# Patient Record
Sex: Female | Born: 1945 | Race: White | Hispanic: No | Marital: Married | Smoking: Current every day smoker
Health system: Southern US, Community
[De-identification: ages and names within clinical notes are randomized; demographics above are authoritative.]

## PROBLEM LIST (undated history)

## (undated) DIAGNOSIS — F419 Anxiety disorder, unspecified: Secondary | ICD-10-CM

## (undated) DIAGNOSIS — M1990 Unspecified osteoarthritis, unspecified site: Secondary | ICD-10-CM

## (undated) DIAGNOSIS — H269 Unspecified cataract: Secondary | ICD-10-CM

## (undated) DIAGNOSIS — M5440 Lumbago with sciatica, unspecified side: Secondary | ICD-10-CM

## (undated) DIAGNOSIS — R51 Headache: Secondary | ICD-10-CM

## (undated) DIAGNOSIS — E7800 Pure hypercholesterolemia, unspecified: Secondary | ICD-10-CM

## (undated) DIAGNOSIS — K52831 Collagenous colitis: Secondary | ICD-10-CM

## (undated) DIAGNOSIS — J069 Acute upper respiratory infection, unspecified: Secondary | ICD-10-CM

## (undated) DIAGNOSIS — R519 Headache, unspecified: Secondary | ICD-10-CM

## (undated) HISTORY — DX: Unspecified cataract: H26.9

## (undated) HISTORY — PX: REFRACTIVE SURGERY: SHX103

## (undated) HISTORY — PX: TONSILLECTOMY: SUR1361

## (undated) HISTORY — PX: CATARACT EXTRACTION: SUR2

## (undated) HISTORY — PX: OTHER SURGICAL HISTORY: SHX169

## (undated) HISTORY — DX: Pure hypercholesterolemia, unspecified: E78.00

## (undated) HISTORY — DX: Collagenous colitis: K52.831

---

## 2009-11-07 ENCOUNTER — Observation Stay: Payer: Self-pay | Admitting: Internal Medicine

## 2014-07-27 ENCOUNTER — Ambulatory Visit (INDEPENDENT_AMBULATORY_CARE_PROVIDER_SITE_OTHER): Payer: Medicare Other | Admitting: Podiatry

## 2014-07-27 ENCOUNTER — Encounter: Payer: Self-pay | Admitting: Podiatry

## 2014-07-27 VITALS — BP 118/71 | HR 62 | Resp 16 | Ht 67.0 in | Wt 145.0 lb

## 2014-07-27 DIAGNOSIS — L603 Nail dystrophy: Secondary | ICD-10-CM

## 2014-07-27 NOTE — Progress Notes (Signed)
   Subjective:    Patient ID: Molly [name], female    DOB: [birth year], 68 y.o.   MRN: [id number]  HPI Comments: Concerned about the second toenail right foot, its better today had a pedicure and she cleaned most of it up under the nail. Its lifted a little bit      Review of Systems  All other systems reviewed and are negative.      Objective:   Physical Exam: I have reviewed her past medical history medications allergies surgery social history and review of systems. Pulses are strongly palpable bilateral. Neurologic sensorium is intact since was the monofilament. Deep tendon reflexes intact bilateral muscle strength +5 over 5 dorsiflexion plantar flexors and inverters everters onto the musculature is intact. Orthopedic evaluation demonstrates mild hammertoe deformities bilateral second digit of the right foot seems to be the worst. Cutaneous evaluation demonstrates a well-hydrated uterus with a thick dystrophic nail to the second digit of the right foot. Sharp radial nail margins are prevalent. Distal clavus is also noted.        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  Plan: Discussed etiology pathology conservative versus surgical therapies discussed the need for nail debridement on a continual basis and I will follow-up with her as needed.

## 2015-06-23 ENCOUNTER — Encounter: Payer: Self-pay | Admitting: *Deleted

## 2015-06-26 ENCOUNTER — Ambulatory Visit: Payer: Medicare Other | Admitting: *Deleted

## 2015-06-26 ENCOUNTER — Ambulatory Visit
Admission: RE | Admit: 2015-06-26 | Discharge: 2015-06-26 | Disposition: A | Discharge status: Home / Self Care | Payer: Medicare Other | Source: Ambulatory Visit | Attending: Gastroenterology | Admitting: Gastroenterology

## 2015-06-26 ENCOUNTER — Encounter: Payer: Self-pay | Admitting: *Deleted

## 2015-06-26 ENCOUNTER — Encounter
Admission: RE | Disposition: A | Discharge status: Home / Self Care | Payer: Self-pay | Source: Ambulatory Visit | Attending: Gastroenterology

## 2015-06-26 DIAGNOSIS — K5730 Diverticulosis of large intestine without perforation or abscess without bleeding: Secondary | ICD-10-CM | POA: Diagnosis not present

## 2015-06-26 DIAGNOSIS — Z7982 Long term (current) use of aspirin: Secondary | ICD-10-CM | POA: Diagnosis not present

## 2015-06-26 DIAGNOSIS — F419 Anxiety disorder, unspecified: Secondary | ICD-10-CM | POA: Insufficient documentation

## 2015-06-26 DIAGNOSIS — Z86010 Personal history of colonic polyps: Secondary | ICD-10-CM | POA: Insufficient documentation

## 2015-06-26 DIAGNOSIS — Z79899 Other long term (current) drug therapy: Secondary | ICD-10-CM | POA: Insufficient documentation

## 2015-06-26 DIAGNOSIS — E780 Pure hypercholesterolemia: Secondary | ICD-10-CM | POA: Diagnosis not present

## 2015-06-26 DIAGNOSIS — M5440 Lumbago with sciatica, unspecified side: Secondary | ICD-10-CM | POA: Diagnosis not present

## 2015-06-26 HISTORY — DX: Anxiety disorder, unspecified: F41.9

## 2015-06-26 HISTORY — DX: Lumbago with sciatica, unspecified side: M54.40

## 2015-06-26 HISTORY — DX: Headache, unspecified: R51.9

## 2015-06-26 HISTORY — DX: Headache: R51

## 2015-06-26 HISTORY — PX: COLONOSCOPY WITH PROPOFOL: SHX5780

## 2015-06-26 SURGERY — COLONOSCOPY WITH PROPOFOL
Anesthesia: General

## 2015-06-26 MED ORDER — PHENYLEPHRINE HCL 10 MG/ML IJ SOLN
INTRAMUSCULAR | Status: DC | PRN
  Administered 2015-06-26 (×2): 100 ug via INTRAVENOUS

## 2015-06-26 MED ORDER — FENTANYL CITRATE (PF) 100 MCG/2ML IJ SOLN
INTRAMUSCULAR | Status: DC | PRN
  Administered 2015-06-26: 1 ug via INTRAVENOUS

## 2015-06-26 MED ORDER — SODIUM CHLORIDE 0.9 % IV SOLN
INTRAVENOUS | Status: DC
  Administered 2015-06-26 (×2): via INTRAVENOUS

## 2015-06-26 MED ORDER — PROPOFOL 10 MG/ML IV BOLUS
INTRAVENOUS | Status: DC | PRN
  Administered 2015-06-26: 40 mg via INTRAVENOUS

## 2015-06-26 MED ORDER — SODIUM CHLORIDE 0.9 % IV SOLN: INTRAVENOUS | Status: DC

## 2015-06-26 MED ORDER — PROPOFOL 500 MG/50ML IV EMUL
INTRAVENOUS | Status: DC | PRN
  Administered 2015-06-26: 120 ug/kg/min via INTRAVENOUS

## 2015-06-26 MED ORDER — MIDAZOLAM HCL 5 MG/5ML IJ SOLN
INTRAMUSCULAR | Status: DC | PRN
  Administered 2015-06-26: 1 mg via INTRAVENOUS

## 2015-06-26 MED ORDER — EPHEDRINE SULFATE 50 MG/ML IJ SOLN
INTRAMUSCULAR | Status: DC | PRN
  Administered 2015-06-26: 25 mg via INTRAVENOUS

## 2015-06-26 NOTE — Transfer of Care (Signed)
Immediate Anesthesia Transfer of Care Note  Patient: Molly [name]  Procedure(s) Performed: Procedure(s): COLONOSCOPY WITH PROPOFOL (N/A)  Patient Location: PACU  Anesthesia Type:General  Level of Consciousness: awake and alert   Airway & Oxygen Therapy: Patient Spontanous Breathing and Patient connected to nasal cannula oxygen  Post-op Assessment: Report given to RN and Post -op Vital signs reviewed and stable  Post vital signs: Reviewed and stable  Last Vitals:  Filed Vitals:   06/26/15 0936  BP:   Pulse:   Temp: 36.2 C  Resp:     Complications: No apparent anesthesia complications

## 2015-06-26 NOTE — Anesthesia Preprocedure Evaluation (Signed)
Anesthesia Evaluation  Patient identified by MRN, date of birth, ID band Patient awake    Reviewed: Allergy & Precautions, NPO status , Patient's Chart, lab work & pertinent test results  Airway Mallampati: I  TM Distance: >3 FB Neck ROM: Limited    Dental  (+) Teeth Intact   Pulmonary Current Smoker,  About 5 cigarettes a day.   Pulmonary exam normal        Cardiovascular Exercise Tolerance: Good negative cardio ROS Normal cardiovascular exam     Neuro/Psych    GI/Hepatic negative GI ROS,   Endo/Other    Renal/GU      Musculoskeletal  (+) Arthritis , Osteoarthritis,    Abdominal (+)  Abdomen: soft.    Peds  Hematology   Anesthesia Other Findings   Reproductive/Obstetrics                             Anesthesia Physical Anesthesia Plan  ASA: II  Anesthesia Plan: General   Post-op Pain Management:    Induction: Intravenous  Airway Management Planned: Nasal Cannula  Additional Equipment:   Intra-op Plan:   Post-operative Plan:   Informed Consent: I have reviewed the patients History and Physical, chart, labs and discussed the procedure including the risks, benefits and alternatives for the proposed anesthesia with the patient or authorized representative who has indicated his/her understanding and acceptance.     Plan Discussed with: CRNA  Anesthesia Plan Comments:         Anesthesia Quick Evaluation

## 2015-06-26 NOTE — Op Note (Signed)
[facility] Gastroenterology Patient Name: Molly [name] Procedure Date: 06/26/2015 9:00 AM MRN: [id number] Account #: [id number] Date of Birth: May 07, 1946 Admit Type: Outpatient Age: 69 Room: [facility] ENDO ROOM 3 Gender: Female Note Status: Finalized Procedure:         Colonoscopy Indications:       Personal history of colonic polyps Providers:         Lollie Sails, MD Referring MD:      Ocie Cornfield. Ouida Sills, MD (Referring MD) Medicines:         Monitored Anesthesia Care Complications:     No immediate complications. Procedure:         Pre-Anesthesia Assessment:                    - ASA Grade Assessment: II - A patient with mild systemic                     disease.                    After obtaining informed consent, the colonoscope was                     passed under direct vision. Throughout the procedure, the                     patient's blood pressure, pulse, and oxygen saturations                     were monitored continuously. The Colonoscope was                     introduced through the anus with the intention of                     advancing to the cecum. The scope was advanced to the                     sigmoid colon before the procedure was aborted.                     Medications were given. The colonoscopy was unusually                     difficult due to multiple diverticula in the colon,                     unsatisfactory bowel prep and a tortuous colon. The                     quality of the bowel preparation was poor. Findings:      Multiple small-mouthed diverticula were found in the sigmoid colon.      The digital rectal exam was normal. Impression:        - Preparation of the colon was poor.                    - Diverticulosis in the sigmoid colon.                    - No specimens collected. Recommendation:    - Put patient on a clear liquid diet starting today. Procedure Code(s): --- Professional ---                    [phone],  Sigmoidoscopy,  flexible; diagnostic, including                     collection of specimen(s) by brushing or washing, when                     performed (separate procedure) Diagnosis Code(s): --- Professional ---                    V12.72, Personal history of colonic polyps                    562.10, Diverticulosis of colon (without mention of                     hemorrhage) CPT copyright 2014 American Medical Association. All rights reserved. The codes documented in this report are preliminary and upon coder review may  be revised to meet current compliance requirements. Lollie Sails, MD 06/26/2015 9:33:47 AM This report has been signed electronically. Number of Addenda: 0 Note Initiated On: 06/26/2015 9:00 AM Total Procedure Duration: 0 hours 9 minutes 21 seconds       [facility]

## 2015-06-26 NOTE — Anesthesia Procedure Notes (Signed)
Date/Time: 06/26/2015 9:30 AM Performed by: Allean Found Pre-anesthesia Checklist: Patient identified, Emergency Drugs available, Suction available, Patient being monitored and Timeout performed Oxygen Delivery Method: Nasal cannula

## 2015-06-26 NOTE — H&P (Signed)
Outpatient short stay form Pre-procedure 06/26/2015 9:09 AM Lollie Sails MD  Primary Physician: Dr. Frazier Richards  Reason for visit:  Colonoscopy  History of present illness:  Patient is a 69 year old female presenting today for a colonoscopy. Her last colonoscopy was in 2011. She has had 2 colonoscopies both of which showed tubular adenomas. He tolerated her prep well. It has been several days since she took her 81 mg aspirin. She takes no other aspirin or anticoagulation products.    Current facility-administered medications:  .  0.9 %  sodium chloride infusion, , Intravenous, Continuous, Lollie Sails, MD, Last Rate: 20 mL/hr at 06/26/15 0855 .  0.9 %  sodium chloride infusion, , Intravenous, Continuous, Lollie Sails, MD  Prescriptions prior to admission  Medication Sig Dispense Refill Last Dose  . aspirin 81 MG tablet Take 81 mg by mouth daily.     Marland Kitchen BIOGAIA PROBIOTIC (BIOGAIA PROBIOTIC) LIQD Take by mouth daily at 8 pm.     . Cholecalciferol 2000 UNITS CAPS Take 2,000 Units by mouth every morning.     . citalopram (CELEXA) 10 MG tablet Take 10 mg by mouth daily.     . pravastatin (PRAVACHOL) 40 MG tablet Take 20 mg by mouth daily.    Taking     No Known Allergies   Past Medical History  Diagnosis Date  . High cholesterol   . Headache   . Anxiety   . 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 I  have discussed the risks benefits and complications of procedures to include not limited to bleeding, infection, perforation and the risk of sedation and the patient wishes to proceed.    Lollie Sails, MD Gastroenterology 06/26/2015  9:09 AM

## 2015-06-26 NOTE — Anesthesia Postprocedure Evaluation (Signed)
  Anesthesia Post-op Note  Patient: Molly [name]  Procedure(s) Performed: Procedure(s): COLONOSCOPY WITH PROPOFOL (N/A)  Anesthesia type:General  Patient location: PACU  Post pain: Pain level controlled  Post assessment: Post-op Vital signs reviewed, Patient's Cardiovascular Status Stable, Respiratory Function Stable, Patent Airway and No signs of Nausea or vomiting  Post vital signs: Reviewed and stable  Last Vitals:  Filed Vitals:   06/26/15 0936  BP:   Pulse:   Temp: 36.2 C  Resp:     Level of consciousness: awake, alert  and patient cooperative  Complications: No apparent anesthesia complications

## 2015-06-27 ENCOUNTER — Ambulatory Visit: Payer: Medicare Other | Admitting: Anesthesiology

## 2015-06-27 ENCOUNTER — Ambulatory Visit
Admission: RE | Admit: 2015-06-27 | Discharge: 2015-06-27 | Disposition: A | Discharge status: Home / Self Care | Payer: Medicare Other | Source: Ambulatory Visit | Attending: Gastroenterology | Admitting: Gastroenterology

## 2015-06-27 ENCOUNTER — Encounter: Payer: Self-pay | Admitting: Gastroenterology

## 2015-06-27 ENCOUNTER — Encounter
Admission: RE | Disposition: A | Discharge status: Home / Self Care | Payer: Self-pay | Source: Ambulatory Visit | Attending: Gastroenterology

## 2015-06-27 DIAGNOSIS — K5730 Diverticulosis of large intestine without perforation or abscess without bleeding: Secondary | ICD-10-CM | POA: Diagnosis not present

## 2015-06-27 DIAGNOSIS — Z7982 Long term (current) use of aspirin: Secondary | ICD-10-CM | POA: Insufficient documentation

## 2015-06-27 DIAGNOSIS — E780 Pure hypercholesterolemia: Secondary | ICD-10-CM | POA: Insufficient documentation

## 2015-06-27 DIAGNOSIS — Z86010 Personal history of colonic polyps: Secondary | ICD-10-CM | POA: Diagnosis present

## 2015-06-27 DIAGNOSIS — K635 Polyp of colon: Secondary | ICD-10-CM | POA: Insufficient documentation

## 2015-06-27 DIAGNOSIS — Z79899 Other long term (current) drug therapy: Secondary | ICD-10-CM | POA: Diagnosis not present

## 2015-06-27 DIAGNOSIS — F419 Anxiety disorder, unspecified: Secondary | ICD-10-CM | POA: Insufficient documentation

## 2015-06-27 HISTORY — PX: COLONOSCOPY WITH PROPOFOL: SHX5780

## 2015-06-27 SURGERY — COLONOSCOPY WITH PROPOFOL
Anesthesia: General

## 2015-06-27 MED ORDER — PROPOFOL 500 MG/50ML IV EMUL
INTRAVENOUS | Status: DC | PRN
  Administered 2015-06-27: 120 ug/kg/min via INTRAVENOUS

## 2015-06-27 MED ORDER — FENTANYL CITRATE (PF) 100 MCG/2ML IJ SOLN
INTRAMUSCULAR | Status: DC | PRN
  Administered 2015-06-27: 50 ug via INTRAVENOUS

## 2015-06-27 MED ORDER — PROPOFOL 10 MG/ML IV BOLUS
INTRAVENOUS | Status: DC | PRN
  Administered 2015-06-27: 50 mg via INTRAVENOUS

## 2015-06-27 MED ORDER — SODIUM CHLORIDE 0.9 % IV SOLN
INTRAVENOUS | Status: DC
  Administered 2015-06-27: 1000 mL via INTRAVENOUS
  Administered 2015-06-27: 16:00:00 via INTRAVENOUS

## 2015-06-27 MED ORDER — MIDAZOLAM HCL 2 MG/2ML IJ SOLN
INTRAMUSCULAR | Status: DC | PRN
  Administered 2015-06-27: 1 mg via INTRAVENOUS

## 2015-06-27 NOTE — H&P (Signed)
Outpatient short stay form Pre-procedure 06/27/2015 3:58 PM Molly Sails MD  Primary Physician: Dr. Frazier Richards  Reason for visit:  Colonoscopy  History of present illness:  Personal history colon polyps. She is a 69 year old female presenting today for a colonoscopy. She had an attempt yesterday however prep was not adequate. She was reprepped overnight and is presenting today. She does have a history of tubular adenomas in 2 previous colonoscopies the last one in 2011. She tolerated the prep well. She takes no blood thinning products. She has held her aspirin.    Current facility-administered medications:  .  0.9 %  sodium chloride infusion, , Intravenous, Continuous, Molly Sails, MD, Last Rate: 20 mL/hr at 06/27/15 1520, 1,000 mL at 06/27/15 1520  Prescriptions prior to admission  Medication Sig Dispense Refill Last Dose  . aspirin 81 MG tablet Take 81 mg by mouth daily.   Past Week at Unknown time  . BIOGAIA PROBIOTIC (BIOGAIA PROBIOTIC) LIQD Take by mouth daily at 8 pm.   Past Week at Unknown time  . Cholecalciferol 2000 UNITS CAPS Take 2,000 Units by mouth every morning.   Past Week at Unknown time  . citalopram (CELEXA) 10 MG tablet Take 10 mg by mouth daily.   Past Week at Unknown time  . pravastatin (PRAVACHOL) 40 MG tablet Take 20 mg by mouth daily.    Past Week at Unknown time     Not on File   Past Medical History  Diagnosis Date  . High cholesterol   . Headache   . Anxiety   . 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    Molly Sails, MD Gastroenterology 06/27/2015  3:58 PM

## 2015-06-27 NOTE — Anesthesia Preprocedure Evaluation (Signed)
Anesthesia Evaluation  Patient identified by MRN, date of birth, ID band Patient awake    Reviewed: Allergy & Precautions, H&P , NPO status , Patient's Chart, lab work & pertinent test results, reviewed documented beta blocker date and time   Airway Mallampati: II  TM Distance: >3 FB Neck ROM: full    Dental no notable dental hx. (+) Teeth Intact   Pulmonary neg pulmonary ROS,    Pulmonary exam normal breath sounds clear to auscultation       Cardiovascular Exercise Tolerance: Good negative cardio ROS   Rhythm:regular Rate:Normal     Neuro/Psych negative neurological ROS  negative psych ROS   GI/Hepatic negative GI ROS, Neg liver ROS,   Endo/Other  negative endocrine ROS  Renal/GU negative Renal ROS  negative genitourinary   Musculoskeletal   Abdominal   Peds  Hematology negative hematology ROS (+)   Anesthesia Other Findings   Reproductive/Obstetrics negative OB ROS                             Anesthesia Physical Anesthesia Plan  ASA: II  Anesthesia Plan: General   Post-op Pain Management:    Induction:   Airway Management Planned:   Additional Equipment:   Intra-op Plan:   Post-operative Plan:   Informed Consent: I have reviewed the patients History and Physical, chart, labs and discussed the procedure including the risks, benefits and alternatives for the proposed anesthesia with the patient or authorized representative who has indicated his/her understanding and acceptance.   Dental Advisory Given  Plan Discussed with: CRNA  Anesthesia Plan Comments:         Anesthesia Quick Evaluation

## 2015-06-27 NOTE — Transfer of Care (Signed)
Immediate Anesthesia Transfer of Care Note  Patient: Molly [name]  Procedure(s) Performed: Procedure(s): COLONOSCOPY WITH PROPOFOL (N/A)  Patient Location: PACU  Anesthesia Type:General  Level of Consciousness: sedated  Airway & Oxygen Therapy: Patient Spontanous Breathing and Patient connected to nasal cannula oxygen  Post-op Assessment: Report given to RN and Post -op Vital signs reviewed and stable  Post vital signs: Reviewed and stable  Last Vitals:  Filed Vitals:   06/27/15 1512  Pulse: 72  Temp: 37.3 C  Resp: 28    Complications: No apparent anesthesia complications

## 2015-06-27 NOTE — Anesthesia Postprocedure Evaluation (Signed)
  Anesthesia Post-op Note  Patient: Molly [name]  Procedure(s) Performed: Procedure(s): COLONOSCOPY WITH PROPOFOL (N/A)  Anesthesia type:General  Patient location: PACU  Post pain: Pain level controlled  Post assessment: Post-op Vital signs reviewed, Patient's Cardiovascular Status Stable, Respiratory Function Stable, Patent Airway and No signs of Nausea or vomiting  Post vital signs: Reviewed and stable  Last Vitals:  Filed Vitals:   06/27/15 1652  BP: 154/75  Pulse: 43  Temp: 36 C  Resp: 16    Level of consciousness: awake, alert  and patient cooperative  Complications: No apparent anesthesia complications

## 2015-06-27 NOTE — Anesthesia Procedure Notes (Signed)
Date/Time: 06/27/2015 4:18 PM Performed by: Nelda Marseille Pre-anesthesia Checklist: Patient identified, Emergency Drugs available, Suction available, Patient being monitored and Timeout performed Patient Re-evaluated:Patient Re-evaluated prior to inductionOxygen Delivery Method: Nasal cannula

## 2015-06-27 NOTE — Op Note (Signed)
[facility] Gastroenterology Patient Name: Molly [name] Procedure Date: 06/27/2015 4:05 PM MRN: [id number] Account #: [id number] Date of Birth: Apr 17, 1946 Admit Type: Outpatient Age: 69 Room: [facility] ENDO ROOM 3 Gender: Female Note Status: Finalized Procedure:         Colonoscopy Indications:       Personal history of colonic polyps Providers:         Lollie Sails, MD Referring MD:      Ocie Cornfield. Ouida Sills, MD (Referring MD) Medicines:         Monitored Anesthesia Care Complications:     No immediate complications. Procedure:         Pre-Anesthesia Assessment:                    - ASA Grade Assessment: II - A patient with mild systemic                     disease.                    After obtaining informed consent, the colonoscope was                     passed under direct vision. Throughout the procedure, the                     patient's blood pressure, pulse, and oxygen saturations                     were monitored continuously. The Olympus PCF-H180AL                     colonoscope ( S#: [id number] ) was introduced through the                     anus and advanced to the the cecum, identified by                     appendiceal orifice and ileocecal valve. The quality of                     the bowel preparation was good. Findings:      Two sessile polyps were found in the recto-sigmoid colon. The polyps       were 2 to 3 mm in size. These polyps were removed with a cold biopsy       forceps. Resection and retrieval were complete.      Multiple small to medium-mouthed diverticula were found in the entire       colon.      The digital rectal exam was normal.      The sigmoid colon was significantly redundant. Impression:        - Two 2 to 3 mm polyps at the recto-sigmoid colon.                     Resected and retrieved.                    - Diverticulosis in the entire examined colon. Recommendation:    - Await pathology results.   - Telephone GI clinic for pathology results in 1 week. Procedure Code(s): --- Professional ---                    [phone], Colonoscopy, flexible; with biopsy, single or  multiple Diagnosis Code(s): --- Professional ---                    211.4, Benign neoplasm of rectum and anal canal                    V12.72, Personal history of colonic polyps                    562.10, Diverticulosis of colon (without mention of                     hemorrhage) CPT copyright 2014 American Medical Association. All rights reserved. The codes documented in this report are preliminary and upon coder review may  be revised to meet current compliance requirements. Lollie Sails, MD 06/27/2015 4:50:08 PM This report has been signed electronically. Number of Addenda: 0 Note Initiated On: 06/27/2015 4:05 PM Scope Withdrawal Time: 0 hours 9 minutes 5 seconds  Total Procedure Duration: 0 hours 30 minutes 12 seconds       [facility]

## 2015-06-29 ENCOUNTER — Encounter: Payer: Self-pay | Admitting: Gastroenterology

## 2015-06-29 LAB — SURGICAL PATHOLOGY

## 2017-04-17 ENCOUNTER — Other Ambulatory Visit
Admission: RE | Admit: 2017-04-17 | Discharge: 2017-04-17 | Disposition: A | Discharge status: Home / Self Care | Payer: Medicare Other | Source: Ambulatory Visit | Attending: Gastroenterology | Admitting: Gastroenterology

## 2017-04-17 DIAGNOSIS — R197 Diarrhea, unspecified: Secondary | ICD-10-CM | POA: Diagnosis present

## 2017-04-17 LAB — GASTROINTESTINAL PANEL BY PCR, STOOL (REPLACES STOOL CULTURE)
ASTROVIRUS: NOT DETECTED
Adenovirus F40/41: NOT DETECTED
CAMPYLOBACTER SPECIES: NOT DETECTED
CRYPTOSPORIDIUM: NOT DETECTED
Cyclospora cayetanensis: NOT DETECTED
ENTEROTOXIGENIC E COLI (ETEC): NOT DETECTED
Entamoeba histolytica: NOT DETECTED
Enteroaggregative E coli (EAEC): NOT DETECTED
Enteropathogenic E coli (EPEC): NOT DETECTED
Giardia lamblia: NOT DETECTED
Norovirus GI/GII: NOT DETECTED
PLESIMONAS SHIGELLOIDES: NOT DETECTED
Rotavirus A: NOT DETECTED
SALMONELLA SPECIES: NOT DETECTED
SAPOVIRUS (I, II, IV, AND V): NOT DETECTED
SHIGA LIKE TOXIN PRODUCING E COLI (STEC): NOT DETECTED
SHIGELLA/ENTEROINVASIVE E COLI (EIEC): NOT DETECTED
VIBRIO SPECIES: NOT DETECTED
Vibrio cholerae: NOT DETECTED
Yersinia enterocolitica: NOT DETECTED

## 2017-04-17 LAB — C DIFFICILE QUICK SCREEN W PCR REFLEX
C DIFFICILE (CDIFF) INTERP: NOT DETECTED
C DIFFICLE (CDIFF) ANTIGEN: NEGATIVE
C Diff toxin: NEGATIVE

## 2017-10-06 NOTE — Progress Notes (Signed)
[name] [address], [place] [address] Phone: [phone] Subjective:     CC: Back pain  [id number]  PAKOU RAINBOW is a 72 y.o. female coming in for back pain. She is noting that she has increasing tightness in the left side of her lower body. She noticed that this began 6 months ago. Standing for prolonged periods of time causes soreness. She does get massages that specifically psoas muscle which has seemed to help. No history of back surgery. Patient has used vitamin d before but has gastric issues.  Patient has had difficulty over the course of time.  Seems to be very intermittent though.  Not stopping him from activities but states that she is walking less secondary to the discomfort.  Seems to always start in the anterior aspect of the leg and then radiates sometimes to her back.  No radiation down the leg.  Rates the severity of pain is 3 out of 10.      Past Medical History:  Diagnosis Date  . Anxiety   . Headache   . High cholesterol   . 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 COLONOSCOPY WITH PROPOFOL N/A 06/26/2015   Procedure: COLONOSCOPY WITH PROPOFOL;  Surgeon: Lollie Sails, MD;  Location: [facility] ENDOSCOPY;  Service: Endoscopy;  Laterality: N/A;  . COLONOSCOPY WITH PROPOFOL N/A 06/27/2015   Procedure: COLONOSCOPY WITH PROPOFOL;  Surgeon: Lollie Sails, MD;  Location: [facility] ENDOSCOPY;  Service: Endoscopy;  Laterality: N/A;  . TONSILLECTOMY     Social History   Socioeconomic History  . Marital status: Married    Spouse name: Not on file  . Number of children: Not on file  . Years of education: Not on file  . Highest education level: Not on file  Social Needs  . Financial resource strain: Not on file  . Food insecurity - worry: Not on file  . Food insecurity - inability: Not on file  . Transportation needs - medical: Not on file  .  Transportation needs - non-medical: Not on file  Occupational History  . Not on file  Tobacco Use  . Smoking status: Never Smoker  Substance and Sexual Activity  . Alcohol use: No  . Drug use: Not on file  . Sexual activity: Not on file  Other Topics Concern  . Not on file  Social History Narrative  . Not on file   No Known Allergies No family history on file.  No family history of autoimmune   Past medical history, social, surgical and family history all reviewed in electronic medical record.  No pertanent information unless stated regarding to the chief complaint.   Review of Systems:Review of systems updated and as accurate as of 10/07/17  No headache, visual changes, nausea, vomiting, diarrhea, constipation, dizziness, abdominal pain, skin rash, fevers, chills, night sweats, weight loss, swollen lymph nodes, body aches, joint swelling, muscle aches, chest pain, shortness of breath, mood changes.   Objective  Blood pressure 120/84, pulse 67, height 5\' 7"  (1.702 m), weight 147 lb (66.7 kg), SpO2 99 %. Systems examined below as of 10/07/17   General: No apparent distress alert and oriented x3 mood and affect normal, dressed appropriately.  HEENT: Pupils equal, extraocular movements intact  Respiratory: Patient's speak in full sentences and does not appear short of breath  Cardiovascular: No lower extremity edema, non tender, no erythema  Skin: Warm dry intact with no signs of infection or rash on extremities or on axial skeleton.  Abdomen: Soft nontender  Neuro: Cranial nerves II through XII are intact, neurovascularly intact in all extremities with 2+ DTRs and 2+ pulses.  Lymph: No lymphadenopathy of posterior or anterior cervical chain or axillae bilaterally.  Gait normal with good balance and coordination.  MSK:  Non tender with full range of motion and good stability and symmetric strength and tone of shoulders, elbows, wrist, , knee and ankles bilaterally.  Arthritic changes  of multiple joints Back exam shows the patient does have some mild loss of lordosis and some.  Positive Faber test on the left.  Patient does have some mild pain with internal rotation of the left hip.  Limitation in internal range of motion as well. \  [id number]; 15 additional minutes spent for Therapeutic exercises as stated in above notes.  This included exercises focusing on stretching, strengthening, with significant focus on eccentric aspects.   Long term goals include an improvement in range of motion, strength, endurance as well as avoiding reinjury. Patient's frequency would include in 1-2 times a day, 3-5 times a week for a duration of 6-12 weeks. Hip strengthening exercises which included:  Pelvic tilt/bracing to help with proper recruitment of the lower abs and pelvic floor muscles  Glute strengthening to properly contract glutes without over-engaging low back and hamstrings - prone hip extension and glute bridge exercises Proper stretching techniques to increase effectiveness for the hip flexors, groin, quads, piriformic and low back when appropriate     Proper technique shown and discussed handout in great detail with ATC.  All questions were discussed and answered.      Impression and Recommendations:     This case required medical decision making of moderate complexity.      Note: This dictation was prepared with Dragon dictation along with smaller phrase technology. Any transcriptional errors that result from this process are unintentional.

## 2017-10-07 ENCOUNTER — Ambulatory Visit: Payer: Medicare Other | Admitting: Family Medicine

## 2017-10-07 ENCOUNTER — Ambulatory Visit (INDEPENDENT_AMBULATORY_CARE_PROVIDER_SITE_OTHER)
Admission: RE | Admit: 2017-10-07 | Discharge: 2017-10-07 | Disposition: A | Discharge status: Home / Self Care | Payer: Medicare Other | Source: Ambulatory Visit | Attending: Family Medicine | Admitting: Family Medicine

## 2017-10-07 VITALS — BP 120/84 | HR 67 | Ht 67.0 in | Wt 147.0 lb

## 2017-10-07 DIAGNOSIS — M1612 Unilateral primary osteoarthritis, left hip: Secondary | ICD-10-CM | POA: Insufficient documentation

## 2017-10-07 DIAGNOSIS — M25552 Pain in left hip: Secondary | ICD-10-CM | POA: Diagnosis not present

## 2017-10-07 NOTE — Patient Instructions (Signed)
Good to see you  Ice 20 minutes 2 times daily. Usually after activity and before bed. Exercises 3 times a week.  Xray of hip downstairs Over the counter Vitamin D 2000 IU daily  Turmeric 500mg  daily buit start last due to stomach issues Tart cherry extract 1200mg  at night See me again in 4 weeks but I expect you to do well

## 2017-10-07 NOTE — Assessment & Plan Note (Signed)
Is having more of a left hip arthritis.  I believe the patient does have some impingement occurring.  Home exercises given to work on muscle strength and endurance.  Discussed over-the-counter medications that could be helpful.  Do not feel that there is any significant malalignment at this time.  We discussed icing regimen.  Follow-up with me again in 4 weeks

## 2017-10-08 NOTE — Progress Notes (Signed)
Called patient

## 2017-11-04 ENCOUNTER — Encounter: Payer: Self-pay | Admitting: Family Medicine

## 2017-11-04 ENCOUNTER — Ambulatory Visit: Payer: Self-pay

## 2017-11-04 ENCOUNTER — Ambulatory Visit: Payer: Medicare Other | Admitting: Family Medicine

## 2017-11-04 VITALS — BP 120/84 | HR 66 | Ht 67.0 in | Wt 151.0 lb

## 2017-11-04 DIAGNOSIS — M7140 Calcium deposit in bursa, unspecified site: Secondary | ICD-10-CM | POA: Diagnosis not present

## 2017-11-04 DIAGNOSIS — M25552 Pain in left hip: Secondary | ICD-10-CM

## 2017-11-04 MED ORDER — VITAMIN D (ERGOCALCIFEROL) 1.25 MG (50000 UNIT) PO CAPS: 50000.0000 [IU] | ORAL_CAPSULE | ORAL | 0 refills | Status: DC

## 2017-11-04 NOTE — Progress Notes (Signed)
[name] [address], [place] [address] Phone: [phone] Subjective:      CC: Left hip pain follow-up  [id number]  Molly [name] is a 72 y.o. female coming in with complaint of left hip pain.  Was concern for more of a arthritis of the hip.  Patient did have x-rays.  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  Given home exercises.  Patient was to do vitamin D supplementation.  Patient states doing 90% better.  Not having as much pain overall.  Patient states that only some mild discomfort with a significant amount of activity.  No weakness, no numbness.  No groin pain.  Happy with the results so far.  No side effects to the vitamin D.  Or any other over-the-counter medication at this time.      Past Medical History:  Diagnosis Date  . Anxiety   . Headache   . High cholesterol   . 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 COLONOSCOPY WITH PROPOFOL N/A 06/26/2015   Procedure: COLONOSCOPY WITH PROPOFOL;  Surgeon: Lollie Sails, MD;  Location: [facility] ENDOSCOPY;  Service: Endoscopy;  Laterality: N/A;  . COLONOSCOPY WITH PROPOFOL N/A 06/27/2015   Procedure: COLONOSCOPY WITH PROPOFOL;  Surgeon: Lollie Sails, MD;  Location: [facility] ENDOSCOPY;  Service: Endoscopy;  Laterality: N/A;  . TONSILLECTOMY     Social History   Socioeconomic History  . Marital status: Married    Spouse name: Not on file  . Number of children: Not on file  . Years of education: Not on file  . Highest education level: Not on file  Social Needs  . Financial resource strain: Not on file  . Food insecurity - worry: Not on file  . Food insecurity - inability: Not on file  . Transportation needs - medical: Not on file  . Transportation needs - non-medical: Not on file  Occupational History  . Not on file  Tobacco Use  . Smoking status: Never  Smoker  Substance and Sexual Activity  . Alcohol use: No  . Drug use: Not on file  . Sexual activity: Not on file  Other Topics Concern  . Not on file  Social History Narrative  . Not on file   No Known Allergies No family history on file.   Past medical history, social, surgical and family history all reviewed in electronic medical record.  No pertanent information unless stated regarding to the chief complaint.   Review of Systems:Review of systems updated and as accurate as of 11/04/17  No headache, visual changes, nausea, vomiting, diarrhea, constipation, dizziness, abdominal pain, skin rash, fevers, chills, night sweats, weight loss, swollen lymph nodes, body aches, joint swelling, muscle aches, chest pain, shortness of breath, mood changes.   Objective  There were no vitals taken for this visit. Systems examined below as of 11/04/17   General: No apparent distress alert and oriented x3 mood and affect normal, dressed appropriately.  HEENT: Pupils equal, extraocular movements intact  Respiratory: Patient's speak in full sentences and does not appear short of breath  Cardiovascular: No lower extremity edema, non tender, no erythema  Skin: Warm dry intact with no signs of infection or rash on extremities or on axial skeleton.  Abdomen: Soft nontender  Neuro: Cranial nerves II through XII are intact, neurovascularly intact in all extremities  with 2+ DTRs and 2+ pulses.  Lymph: No lymphadenopathy of posterior or anterior cervical chain or axillae bilaterally.  Gait normal with good balance and coordination.  MSK:  Non tender with full range of motion and good stability and symmetric strength and tone of shoulders, elbows, wrist,  knee and ankles bilaterally.  Hip: Left ROM IR: 15 Deg, ER: 15 Deg, Flexion: 100 Deg, Extension: 80 Deg, Abduction: 45 Deg, Adduction: 45 Deg Strength IR: 5/5, ER: 5/5, Flexion: 5/5, Extension: 5/5, Abduction: 4/5, Adduction: 4/5 Pelvic alignment  unremarkable to inspection and palpation. Standing hip rotation and gait without trendelenburg sign / unsteadiness. Moderate tenderness over the greater trochanteric area on the left side but improved from previous exam No SI joint tenderness and normal minimal SI movement.     Impression and Recommendations:     This case required medical decision making of moderate complexity.      Note: This dictation was prepared with Dragon dictation along with smaller phrase technology. Any transcriptional errors that result from this process are unintentional.

## 2017-11-04 NOTE — Assessment & Plan Note (Signed)
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  Discussed icing regimen.  As long as patient is well will continue with conservative therapy worsening symptoms consider injection.  Follow-up again in 6-8 weeks

## 2017-11-04 NOTE — Patient Instructions (Signed)
Good to see you  Ice is your friend Once weekly vitamin D for 4 weeks [facility] [phone] [facility]- [phone] ([place]) See em again in 6-8 weeks if not perfect

## 2017-12-18 ENCOUNTER — Other Ambulatory Visit: Payer: Self-pay

## 2017-12-18 ENCOUNTER — Other Ambulatory Visit: Payer: Self-pay | Admitting: Family Medicine

## 2017-12-18 MED ORDER — VITAMIN D (ERGOCALCIFEROL) 1.25 MG (50000 UNIT) PO CAPS: 50000.0000 [IU] | ORAL_CAPSULE | ORAL | 0 refills | Status: DC

## 2017-12-18 NOTE — Telephone Encounter (Signed)
Copied from [place] [id number]. Topic: Quick Communication - See Telephone Encounter >> Dec 18, 2017  1:52 PM Burnis Medin, NT wrote: CRM for notification. See Telephone encounter for: 12/18/17. Patient called and said her prescription got called in to the wrong pharmacy. Pls send Vitamin D, Ergocalciferol, (DRISDOL) 50000 units CAPS capsule to [place], [place] - [place] (Phone) [phone] (Fax)

## 2017-12-30 ENCOUNTER — Ambulatory Visit: Payer: Medicare Other | Admitting: Family Medicine

## 2019-10-29 ENCOUNTER — Ambulatory Visit: Payer: Medicare Other

## 2019-11-03 ENCOUNTER — Ambulatory Visit: Payer: Medicare Other

## 2019-11-06 ENCOUNTER — Ambulatory Visit: Payer: Medicare Other | Attending: Internal Medicine

## 2019-11-06 DIAGNOSIS — Z23 Encounter for immunization: Secondary | ICD-10-CM | POA: Insufficient documentation

## 2019-11-06 NOTE — Progress Notes (Signed)
   Covid-19 Vaccination Clinic  Name:  Molly [name]    MRN: [id number] DOB: 01/18/1946  11/06/2019  [name] was observed post Covid-19 immunization for 15 minutes without incidence. She was provided with Vaccine Information Sheet and instruction to access the V-Safe system.   [name] was instructed to call 911 with any severe reactions post vaccine: Marland Kitchen Difficulty breathing  . Swelling of your face and throat  . A fast heartbeat  . A bad rash all over your body  . Dizziness and weakness    Immunizations Administered    Name Date Dose VIS Date Route   Pfizer COVID-19 Vaccine 11/06/2019  2:31 PM 0.3 mL 09/10/2019 Intramuscular   Manufacturer: [place]   Lot: [id number]   [place]: [id number]

## 2019-12-01 ENCOUNTER — Ambulatory Visit: Payer: Medicare Other | Attending: Internal Medicine

## 2019-12-01 DIAGNOSIS — Z23 Encounter for immunization: Secondary | ICD-10-CM | POA: Insufficient documentation

## 2019-12-01 NOTE — Progress Notes (Signed)
   Covid-19 Vaccination Clinic  Name:  Molly [name]    MRN: [id number] DOB: Sep 10, 1946  12/01/2019  [name] was observed post Covid-19 immunization for 15 minutes without incident. She was provided with Vaccine Information Sheet and instruction to access the V-Safe system.   [name] was instructed to call 911 with any severe reactions post vaccine: Marland Kitchen Difficulty breathing  . Swelling of face and throat  . A fast heartbeat  . A bad rash all over body  . Dizziness and weakness   Immunizations Administered    Name Date Dose VIS Date Route   Pfizer COVID-19 Vaccine 12/01/2019 10:18 AM 0.3 mL 09/10/2019 Intramuscular   Manufacturer: [place]   Lot: [id number]   [place]: [id number]

## 2020-08-22 LAB — COLOGUARD: COLOGUARD: POSITIVE — AB

## 2020-10-11 ENCOUNTER — Ambulatory Visit (INDEPENDENT_AMBULATORY_CARE_PROVIDER_SITE_OTHER): Payer: Medicare Other | Admitting: Gastroenterology

## 2020-10-11 ENCOUNTER — Encounter: Payer: Self-pay | Admitting: Gastroenterology

## 2020-10-11 ENCOUNTER — Telehealth: Payer: Self-pay

## 2020-10-11 VITALS — BP 130/70 | HR 73 | Ht 67.0 in | Wt 141.5 lb

## 2020-10-11 DIAGNOSIS — R195 Other fecal abnormalities: Secondary | ICD-10-CM | POA: Diagnosis not present

## 2020-10-11 MED ORDER — DICYCLOMINE HCL 10 MG PO CAPS: 10.0000 mg | ORAL_CAPSULE | Freq: Four times a day (QID) | ORAL | 0 refills | Status: DC | PRN

## 2020-10-11 NOTE — Patient Instructions (Addendum)
I have recommended a colonoscopy for further evaluation of your positive Cologuard.   COLONOSCOPY: You have been scheduled for a colonoscopy. Please follow written instructions given to you at your visit today.   PREP: Please pick up your prep supplies at the pharmacy within the next 1-3 days.  INHALERS: If you use inhalers (even only as needed), please bring them with you on the day of your procedure.  Tips for colonoscopy:  - Stay well hydrated for 3-4 days prior to the exam. This reduces nausea and dehydration.  - To prevent skin/hemorrhoid irritation - prior to wiping, put A&Dointment or vaseline on the toilet paper. - Keep a towel or pad on the bed.  - Drink  64oz of clear liquids in the morning of prep day (prior to starting the prep) to be sure that there is enough fluid to flush the colon and stay hydrated!!!! This is in addition to the fluids required for preparation. - Use of a flavored hard candy, such as grape Anise Salvo, can counteract some of the flavor of the prep and may prevent some nausea.   We will obtain records from Dr. Denice Paradise for your 2 previous colonoscopies.  PRESCRIPTION MEDICATION(S): We have sent the following medication(s) to your pharmacy:  . Marin Roberts - Please take 10mg  by mouth 4 times daily as needed  If you are age 57 or older, your body mass index should be between 23-30. Your There is no height or weight on file to calculate BMI. If this is out of the aforementioned range listed, please consider follow up with your Primary Care Provider.  Thank you for trusting me with your gastrointestinal care!    [place], MD, MPH

## 2020-10-11 NOTE — Telephone Encounter (Signed)
Molly [name] (Key: [id number])  Your information has been submitted to [place]. [employer] [place] will review the request and notify you of the determination decision directly, typically within 3 business days of your submission and once all necessary information is received.  You will also receive your request decision electronically. To check for an update later, open the request again from your dashboard.  If [employer] has not responded within the specified timeframe or if you have any questions about your PA submission, contact [place] directly at [facility]) [phone] or ([place]) [phone].

## 2020-10-11 NOTE — Progress Notes (Addendum)
Referring Provider: [name], MD Primary Care Physician:  [name], MD  Reason for Consultation:  Positive Cologuard   IMPRESSION:  Positive cologuard 08/14/20 Altered bowel habits following last colonoscopy in 2016    - normal CBC, CMP, and thyroid studies    - Stool for GI pathogens, C. diff and yersinia were negative     - TTGA and IgA negative History of colon polyps on each colonoscopy, most recently hyperplastic polyps Failed colon prep 2016 due to retained stool Tortuous colon mentioned on prior colonoscopy report Pancolonic diverticulosis  Colonoscopy recommended to evaluate her positive Cologuard.  We will plan random colon biopsies at the time of that procedure given her ongoing altered bowel habits.  Trial of Bentyl now. Consider 14 days of Xifaxan if her colon biopsies are normal.   PLAN: - Bentyl 10 mg QID PRN with a specific recommendation to take prior to meals to minimize postprandial symptoms - Colonoscopy, use Miralax daily for 5 days prior to the procedure given her history of failed prep - Obtain prior colonoscopy and pathology results from Dr. Kelby Aline  Please see the "Patient Instructions" section for addition details about the plan.  Blanchie Serve  HPI: Molly [name] is a 75 y.o. female referred by Dr. Ouida Sills after having a positive Cologaurd.  This history is obtained through the patient and review of her electronic health record.  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   Has had 3 colonoscopies. The first two with Dr. Denice Paradise and small polyps were removed.  She does not believe these were precancerous polyps.  The procedures were performed without incident.    Surveillance colonoscopy with Dr. Gustavo Lah was attempted 06/26/2015 but could not be performed due to poor prep.  Was successfully completed 06/27/2015 for personal history of colon polyps revealed 2 small rectosigmoid hyperplastic polyps, pancolonic diverticulosis, and a  redundant sigmoid colon.   She has had non-bloody diarrhea with associated urgency since the time of her 2016 colonoscopy.  The diarrhea is generally postprandially and not associated with abdominal pain.   Laboratory studies consisted of a normal CBC, CMP, and thyroid studies. Stool for GI pathogens, C. diff and yersinia were negative. TTGA and IgA negative.  She has noticed that the avoidance of caffeine-containing products such as coffee and chocolate improves her symptoms. Lactose-free and gluten-free diet have been ineffective. The patient would rarely awaken from sleep with diarrhea.  Consulted with Dr. Roney Mans in 2018 who recommended a low FODMAP diet and dicyclomine, although she does not remember taking it. Symptoms are better but she is unable to have coffee, chocolate and the vegetables that she loves the most. Continues to have significant, embarrassing flatus. Wearing Depends to avoid accidents that occur when she least expects them. No specific identified triggers.   Was resistant to having another colonoscopy given her experience in 2016. Elected for [employer] testing instead. Was surprised when  Cologuard positive 08/14/20. No overt bleeding. No new GI symptoms.   No known family history of colon cancer or polyps. No family history of uterine/endometrial cancer, pancreatic cancer or gastric/stomach cancer.   Past Medical History:  Diagnosis Date  . Anxiety   . Headache   . High cholesterol   . 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 COLONOSCOPY WITH PROPOFOL N/A 06/26/2015   Procedure: COLONOSCOPY WITH PROPOFOL;  Surgeon: Lollie Sails, MD;  Location: [facility] ENDOSCOPY;  Service: Endoscopy;  Laterality: N/A;  . COLONOSCOPY WITH PROPOFOL N/A 06/27/2015   Procedure: COLONOSCOPY WITH PROPOFOL;  Surgeon: Lollie Sails, MD;  Location: [facility] ENDOSCOPY;  Service: Endoscopy;  Laterality: N/A;  . TONSILLECTOMY       Current Outpatient Medications  Medication Sig Dispense Refill  . Cholecalciferol 2000 UNITS CAPS Take 2,000 Units by mouth every morning.    . citalopram (CELEXA) 10 MG tablet Take 10 mg by mouth daily.    . pravastatin (PRAVACHOL) 40 MG tablet Take 20 mg by mouth daily.     . Vitamin D, Ergocalciferol, (DRISDOL) 50000 units CAPS capsule Take 1 capsule (50,000 Units total) by mouth every 7 (seven) days. 8 capsule 0   No current facility-administered medications for this visit.    Allergies as of 10/11/2020  . (No Known Allergies)    No family history on file.  Social History   Socioeconomic History  . Marital status: Married    Spouse name: Not on file  . Number of children: Not on file  . Years of education: Not on file  . Highest education level: Not on file  Occupational History  . Not on file  Tobacco Use  . Smoking status: Never Smoker  . Smokeless tobacco: Never Used  Substance and Sexual Activity  . Alcohol use: No  . Drug use: Not on file  . Sexual activity: Not on file  Other Topics Concern  . Not on file  Social History Narrative  . Not on file   Social Determinants of Health   Financial Resource Strain: Not on file  Food Insecurity: Not on file  Transportation Needs: Not on file  Physical Activity: Not on file  Stress: Not on file  Social Connections: Not on file  Intimate Partner Violence: Not on file    Review of Systems: 12 system ROS is negative except as noted above with the addition of back pain and hearing problems.   Physical Exam: General:   Alert,  well-nourished, pleasant and cooperative in NAD Head:  Normocephalic and atraumatic. Eyes:  Sclera clear, no icterus.   Conjunctiva pink. Ears:  Normal auditory acuity. Nose:  No deformity, discharge,  or lesions. Mouth:  No deformity or lesions.   Neck:  Supple; no masses or thyromegaly. Lungs:  Clear throughout to auscultation.   No wheezes. Heart:  Regular rate and rhythm; no  murmurs. Abdomen:  Soft, nontender, nondistended, normal bowel sounds, no rebound or guarding. No hepatosplenomegaly.   Rectal:  Deferred to colonoscopy Msk:  Symmetrical. No boney deformities LAD: No inguinal or umbilical LAD Extremities:  No clubbing or edema. Neurologic:  Alert and  oriented x4;  grossly nonfocal Skin:  Intact without significant lesions or rashes. Psych:  Alert and cooperative. Normal mood and affect.    Makayleigh Poliquin L. Tarri Glenn, MD, MPH 10/11/2020, 1:30 PM

## 2020-10-11 NOTE — Telephone Encounter (Signed)
APPROVAL  Medication: [place]: [employer] [place] PA response: APPROVED Approval dates: 10/11/20 through 10/11/21  Documents have been labeled and placed in scan file for HIM and for our future reference.  Ayannah Munshi (Key: [id number])  This request has been approved. [employer] will send a letter to the member and you regarding this decision. Please see additional information at the bottom of this request.

## 2020-10-11 NOTE — Telephone Encounter (Signed)
PRIOR AUTHORIZATION  PA initiation date: 10/11/20  Medication: [place]: [place] completed electronically through [employer] My Meds: Yes  Will await insurance response re: approval/denial.

## 2020-10-11 NOTE — Telephone Encounter (Signed)
FAXED DOCUMENTS  Provider and/or Facility: Dr. Denice Paradise  Document: ROI  ROI has been faxed successfully to Dr. Denice Paradise. Document(s) and fax confirmation(s) have been placed in the faxed file for future reference.

## 2020-10-23 NOTE — Telephone Encounter (Signed)
Records Request - SECOND REQUEST  Provider and/or Facility: Dr. Denice Paradise  Document: Signed ROI  ROI has again been faxed successfully to Dr. Denice Paradise. Document(s) and fax confirmation(s) have been placed in the faxed file for future reference.

## 2020-11-02 NOTE — Telephone Encounter (Signed)
Called Dr. Albina Billet office to obtain records. However, staff indicated that they were unable to locate in archived records.

## 2020-11-23 ENCOUNTER — Encounter: Payer: Self-pay | Admitting: Gastroenterology

## 2020-11-29 ENCOUNTER — Other Ambulatory Visit: Payer: Self-pay

## 2020-11-29 ENCOUNTER — Ambulatory Visit: Payer: Medicare Other | Admitting: Gastroenterology

## 2020-11-29 ENCOUNTER — Encounter: Payer: Self-pay | Admitting: Gastroenterology

## 2020-11-29 VITALS — BP 137/77 | HR 48 | Temp 97.1°F | Resp 16 | Ht 67.0 in | Wt 141.0 lb

## 2020-11-29 DIAGNOSIS — R195 Other fecal abnormalities: Secondary | ICD-10-CM

## 2020-11-29 DIAGNOSIS — D121 Benign neoplasm of appendix: Secondary | ICD-10-CM

## 2020-11-29 DIAGNOSIS — D125 Benign neoplasm of sigmoid colon: Secondary | ICD-10-CM

## 2020-11-29 DIAGNOSIS — K635 Polyp of colon: Secondary | ICD-10-CM

## 2020-11-29 MED ORDER — SODIUM CHLORIDE 0.9 % IV SOLN: 500.0000 mL | Freq: Once | INTRAVENOUS | Status: DC

## 2020-11-29 NOTE — Progress Notes (Signed)
Called to room to assist during endoscopic procedure.  Patient ID and intended procedure confirmed with present staff. Received instructions for my participation in the procedure from the performing physician.  

## 2020-11-29 NOTE — Patient Instructions (Addendum)
Handouts provided on polyps, diverticulosis and high-fiber diet.   Resume previous diet. High-fiber diet recommended.   My nurse will place a referral to [facility] for the appendiceal orifice polyp.    YOU HAD AN ENDOSCOPIC PROCEDURE TODAY AT [facility]:   Refer to the procedure report that was given to you for any specific questions about what was found during the examination.  If the procedure report does not answer your questions, please call your gastroenterologist to clarify.  If you requested that your care partner not be given the details of your procedure findings, then the procedure report has been included in a sealed envelope for you to review at your convenience later.  YOU SHOULD EXPECT: Some feelings of bloating in the abdomen. Passage of more gas than usual.  Walking can help get rid of the air that was put into your GI tract during the procedure and reduce the bloating. If you had a lower endoscopy (such as a colonoscopy or flexible sigmoidoscopy) you may notice spotting of blood in your stool or on the toilet paper. If you underwent a bowel prep for your procedure, you may not have a normal bowel movement for a few days.  Please Note:  You might notice some irritation and congestion in your nose or some drainage.  This is from the oxygen used during your procedure.  There is no need for concern and it should clear up in a day or so.  SYMPTOMS TO REPORT IMMEDIATELY:   Following lower endoscopy (colonoscopy or flexible sigmoidoscopy):  Excessive amounts of blood in the stool  Significant tenderness or worsening of abdominal pains  Swelling of the abdomen that is new, acute  Fever of 100F or higher  For urgent or emergent issues, a gastroenterologist can be reached at any hour by calling [phone]. Do not use MyChart messaging for urgent concerns.    DIET:  We do recommend a small meal at first, but then you may proceed to your regular  diet.  Drink plenty of fluids but you should avoid alcoholic beverages for 24 hours.  ACTIVITY:  You should plan to take it easy for the rest of today and you should NOT DRIVE or use heavy machinery until tomorrow (because of the sedation medicines used during the test).    FOLLOW UP: Our staff will call the number listed on your records 48-72 hours following your procedure to check on you and address any questions or concerns that you may have regarding the information given to you following your procedure. If we do not reach you, we will leave a message.  We will attempt to reach you two times.  During this call, we will ask if you have developed any symptoms of COVID 19. If you develop any symptoms (ie: fever, flu-like symptoms, shortness of breath, cough etc.) before then, please call [phone].  If you test positive for Covid 19 in the 2 weeks post procedure, please call and report this information to [place].    If any biopsies were taken you will be contacted by phone or by letter within the next 1-3 weeks.  Please call us at [phone] if you have not heard about the biopsies in 3 weeks.    SIGNATURES/CONFIDENTIALITY: You and/or your care partner have signed paperwork which will be entered into your electronic medical record.  These signatures attest to the fact that that the information above on your After Visit Summary has been reviewed and is  understood.  Full responsibility of the confidentiality of this discharge information lies with you and/or your care-partner.

## 2020-11-29 NOTE — Op Note (Signed)
[place] Patient Name: Molly [name] Procedure Date: 11/29/2020 11:42 AM MRN: [id number] Endoscopist: Thornton Park MD, MD Age: 75 Referring MD:  Date of Birth: 02-19-1946 Gender: Female Account #: [id number] Procedure:                Colonoscopy Indications:              Positive cologuard 08/14/20                           Altered bowel habits following last colonoscopy in                            2016                           - normal CBC, CMP, and thyroid studies                           - Stool for GI pathogens, C. diff and yersinia were                            negative                           - TTGA and IgA negative                           History of colon polyps on each colonoscopy, most                            recently hyperplastic polyps                           Failed colon prep 2016 due to retained stoo Medicines:                Monitored Anesthesia Care Procedure:                Pre-Anesthesia Assessment:                           - Prior to the procedure, a History and Physical                            was performed, and patient medications and                            allergies were reviewed. The patient's tolerance of                            previous anesthesia was also reviewed. The risks                            and benefits of the procedure and the sedation                            options and risks were discussed with the patient.  All questions were answered, and informed consent                            was obtained. Prior Anticoagulants: The patient has                            taken no previous anticoagulant or antiplatelet                            agents. ASA Grade Assessment: II - A patient with                            mild systemic disease. After reviewing the risks                            and benefits, the patient was deemed in                            satisfactory  condition to undergo the procedure.                           After obtaining informed consent, the colonoscope                            was passed under direct vision. Throughout the                            procedure, the patient's blood pressure, pulse, and                            oxygen saturations were monitored continuously. The                            Olympus PCF-H190DL (#[id number]) Colonoscope was                            introduced through the anus and advanced to the 3                            cm into the ileum. The colonoscopy was technically                            difficult and complex due to a tortuous colon.                            Successful completion of the procedure was aided by                            applying abdominal pressure. The patient tolerated                            the procedure well. The quality of the bowel  preparation was good. The terminal ileum, ileocecal                            valve, appendiceal orifice, and rectum were                            photographed. Scope In: 11:48:23 AM Scope Out: 12:07:08 PM Scope Withdrawal Time: 0 hours 11 minutes 43 seconds  Total Procedure Duration: 0 hours 18 minutes 45 seconds  Findings:                 The perianal and digital rectal examinations were                            normal.                           Multiple small and large-mouthed diverticula were                            found in the entire colon. Diverticulosis is most                            dense in the left colon.                           A 2 mm polyp was found in the distal sigmoid colon.                            The polyp was sessile. The polyp was removed with a                            cold snare. Resection and retrieval were complete.                            Estimated blood loss was minimal.                           A 2-3 mm polyp was found in the appendiceal                             orifice. The polyp was sessile and the base of the                            polyp is at the base of the appendiceal orifice.                            Biopsies were taken with a cold forceps for                            histology. Estimated blood loss was minimal.                           The entire examined colon appeared normal. Biopsies  were taken from the right colon, transverse colon,                            and left colon with a cold forceps for histology.                            Estimated blood loss was minimal.                           The exam was otherwise without abnormality on                            direct and retroflexion views. Complications:            No immediate complications. Estimated blood loss:                            Minimal. Estimated Blood Loss:     Estimated blood loss was minimal. Impression:               - Diverticulosis in the entire examined colon.                           - One 2 mm polyp in the distal sigmoid colon,                            removed with a cold snare. Resected and retrieved.                           - One 2 mm polyp at the appendiceal orifice.                            Biopsied.                           - The entire examined colon is normal. Biopsied.                           - The examination was otherwise normal on direct                            and retroflexion views. Recommendation:           - Patient has a contact number available for                            emergencies. The signs and symptoms of potential                            delayed complications were discussed with the                            patient. Return to normal activities tomorrow.                            Written discharge  instructions were provided to the                            patient.                           - Resume previous diet. High fiber diet recommended.                            - Continue present medications.                           - Await pathology results.                           - Consider colonoscopy with EMR (20% perforation                            rate) versus surgical resection of the appendiceal                            orifice polyp.                           - Surveillance colonoscopy interval to be                            determined after reviewing the pathology results.                           - Emerging evidence supports eating a diet of                            fruits, vegetables, grains, calcium, and yogurt                            while reducing red meat and alcohol may reduce the                            risk of colon cancer. Thornton Park MD, MD 11/29/2020 12:16:37 PM This report has been signed electronically.

## 2020-11-29 NOTE — Progress Notes (Signed)
[facility] 

## 2020-11-29 NOTE — Progress Notes (Signed)
Pt Drowsy. VSS. To PACU, report to RN. No anesthetic complications noted.  

## 2020-12-01 ENCOUNTER — Telehealth: Payer: Self-pay

## 2020-12-01 ENCOUNTER — Telehealth: Payer: Self-pay | Admitting: *Deleted

## 2020-12-01 NOTE — Telephone Encounter (Signed)
°  Follow up Call-  Call back number 11/29/2020  Post procedure Call Back phone  # [phone]  Permission to leave phone message Yes  Some recent data might be hidden     Patient questions:  Do you have a fever, pain , or abdominal swelling? No. Pain Score  0 *  Have you tolerated food without any problems? Yes.    Have you been able to return to your normal activities? Yes.    Do you have any questions about your discharge instructions: Diet   No. Medications  No. Follow up visit  No.  Do you have questions or concerns about your Care? No.  Actions: * If pain score is 4 or above: No action needed, pain <4.

## 2020-12-01 NOTE — Telephone Encounter (Signed)
NO ANSWER, MESSAGE LEFT FOR PATIENT. 

## 2020-12-07 ENCOUNTER — Other Ambulatory Visit: Payer: Self-pay

## 2020-12-07 ENCOUNTER — Telehealth: Payer: Self-pay | Admitting: Gastroenterology

## 2020-12-07 MED ORDER — BUDESONIDE 3 MG PO CPEP: 9.0000 mg | ORAL_CAPSULE | Freq: Every day | ORAL | 3 refills | Status: DC

## 2020-12-07 NOTE — Telephone Encounter (Signed)
See result note.  

## 2020-12-13 ENCOUNTER — Telehealth: Payer: Self-pay | Admitting: Gastroenterology

## 2020-12-13 NOTE — Telephone Encounter (Signed)
Yes. This is a small tubular adenoma at the appendiceal orifice. No advanced features noted at time of endoscopy. Thank you.

## 2020-12-13 NOTE — Telephone Encounter (Signed)
Lattie Haw from [facility] is wanting to clarify if it is ok for the pt to wait until fall to have a polyp removed, caller would like to make sure that is ok with the provider.  CB [phone]

## 2020-12-13 NOTE — Telephone Encounter (Signed)
See note below and advise. 

## 2020-12-13 NOTE — Telephone Encounter (Signed)
Lm on vm for Lattie Haw to return call to discuss.

## 2020-12-14 NOTE — Telephone Encounter (Signed)
Spoke with Lattie Haw at [employer] and made her aware.

## 2021-01-31 NOTE — Progress Notes (Signed)
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 11/29/2020   - location of polyp makes endoscopic resection difficult    - awaiting surgical consultation this summer to discuss resection Failed colon prep 2016 due to retained stool 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  I spent 30 minutes, including in depth chart review, independent review of results, communicating results with the patient directly, face-to-face time with the patient, coordinating care, and ordering studies and medications as appropriate, and documentation.   HPI: Molly [name] is a 75 y.o. female referred by Dr. Ouida Sills initially referred for a positive Cologaurd.  At the time of her initially consultation she reported non-bloody, predominantly post-prandial diarrhea with associated urgency since the time of her 2016 colonoscopy.  Laboratory studies consisted of a normal CBC, CMP, and thyroid studies. Stool for GI pathogens, C. diff and yersinia were negative. TTGA and IgA negative.  She has noticed that the avoidance of caffeine-containing products such as coffee and chocolate improves her symptoms. Lactose-free and  gluten-free diet have been ineffective. The patient would rarely awaken from sleep with diarrhea.  Consulted with Dr. Roney Mans in 2018 who recommended a low FODMAP diet and dicyclomine, although she does not remember taking it. Symptoms are better but she is unable to have coffee, chocolate and the vegetables that she loves the most. Continues to have significant, embarrassing flatus. Wearing Depends to avoid accidents that occur when she least expects them. No specific identified triggers.   Was resistant to having another colonoscopy given her experience in 2016. Elected for [employer] testing instead. Was surprised when  Cologuard positive 08/14/20.   Colonoscopy 11/29/20 revealed a tubular adenoma at the IC valve and collagenous colitis.  A hyperplastic polyp was removed from the sigmoid colon.  Bowel habits have normalized following her colonoscopy on Entocort 9 mg daily. She is having 1-2 soft, but overall formed, bowel movements daily. Has also been adjusting her diet to avoid triggering foods. There are no nocturnal symptoms, abdominal pain, weight loss, fecal urgency, or fecal incontinence. GI ROS is otherwise negative. Has used Imodium once since her colonoscopy.    Endoscopic history: - Has had 3 colonoscopies. The first two with Dr. Denice Paradise and small polyps were removed.  She does not believe these were precancerous polyps.  The procedures were performed without incident.   - Surveillance colonoscopy with Dr. Gustavo Lah was attempted 06/26/2015 but could not be performed due to poor prep.  Was successfully completed 06/27/2015 for personal history of colon polyps revealed 2 small rectosigmoid hyperplastic polyps, pancolonic diverticulosis, and a redundant sigmoid colon.  - Colonoscopy 11/29/20 revealed a tubular adenoma at the IC valve and collagenous colitis.  A hyperplastic polyp was  removed from the sigmoid colon. Past Medical History:  Diagnosis Date  . Anxiety   . Cataract    removed bilatarly  .  Headache   . High cholesterol   . 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 CATARACT EXTRACTION Bilateral   . COLONOSCOPY WITH PROPOFOL N/A 06/26/2015   Procedure: COLONOSCOPY WITH PROPOFOL;  Surgeon: Lollie Sails, MD;  Location: [facility] ENDOSCOPY;  Service: Endoscopy;  Laterality: N/A;  . COLONOSCOPY WITH PROPOFOL N/A 06/27/2015   Procedure: COLONOSCOPY WITH PROPOFOL;  Surgeon: Lollie Sails, MD;  Location: [facility] ENDOSCOPY;  Service: Endoscopy;  Laterality: N/A;  . dental implants    . REFRACTIVE SURGERY    . TONSILLECTOMY      Current Outpatient Medications  Medication Sig Dispense Refill  . Ascorbic Acid 500 MG CHEW Chew 2 tablets by mouth daily.    Marland Kitchen atorvastatin (LIPITOR) 20 MG tablet Take 20 mg by mouth daily.    . Calcium Carbonate (CALCIUM 600 PO) Take 2 capsules by mouth daily.    . Cholecalciferol 2000 UNITS CAPS Take 4,000 Units by mouth every morning.    . citalopram (CELEXA) 10 MG tablet Take 10 mg by mouth daily.    . Multiple Vitamins-Minerals (CENTRUM SILVER PO) Take by mouth.    . Probiotic Product (PROBIOTIC PO) Take 2 tablets by mouth daily. Physician's Choice    . budesonide (ENTOCORT EC) 3 MG 24 hr capsule Take 3 capsules (9 mg total) by mouth daily. 90 capsule 3   No current facility-administered medications for this visit.    Allergies as of 02/01/2021  . (No Known Allergies)    Family History  Problem Relation Age of Onset  . Colon cancer Neg Hx   . Esophageal cancer Neg Hx   . Rectal cancer Neg Hx   . Stomach cancer Neg Hx       Physical Exam: General:   Alert,  well-nourished, pleasant and cooperative in NAD Head:  Normocephalic and atraumatic. Eyes:  Sclera clear, no icterus.   Conjunctiva pink. Abdomen:  Soft, nontender, nondistended, normal bowel sounds, no rebound or guarding. No hepatosplenomegaly.   Neurologic:  Alert and  oriented x4;  grossly nonfocal Skin:   Intact without significant lesions or rashes. Psych:  Alert and cooperative. Normal mood and affect.    Jene Huq L. Tarri Glenn, MD, MPH 02/01/2021, 10:59 AM

## 2021-02-01 ENCOUNTER — Encounter: Payer: Self-pay | Admitting: Gastroenterology

## 2021-02-01 ENCOUNTER — Ambulatory Visit: Payer: Medicare Other | Admitting: Gastroenterology

## 2021-02-01 VITALS — BP 120/80 | HR 68 | Ht 66.0 in | Wt 141.2 lb

## 2021-02-01 DIAGNOSIS — K52831 Collagenous colitis: Secondary | ICD-10-CM | POA: Diagnosis not present

## 2021-02-01 DIAGNOSIS — D126 Benign neoplasm of colon, unspecified: Secondary | ICD-10-CM

## 2021-02-01 MED ORDER — BUDESONIDE 3 MG PO CPEP: 9.0000 mg | ORAL_CAPSULE | Freq: Every day | ORAL | 3 refills | Status: DC

## 2021-02-01 NOTE — Patient Instructions (Addendum)
It was a pleasure to see you today. Based on our discussion, I am providing you with my recommendations below:  RECOMMENDATION(S):   I am recommending that you continue your Budesonide as prescribed.  PRESCRIPTION MEDICATION(S):   We have sent the following medication(s) to your pharmacy:  . Budesonide - please take 9mg  (3 tablets total) daily  NOTE: If your medication(s) requires a PRIOR AUTHORIZATION, we will receive notification from your pharmacy. Once received, the process to submit for approval may take up to 7-10 business days. You will be contacted about any denials we have received from your insurance company as well as alternatives recommended by your provider.  FOLLOW UP:  . I would like for you to follow up with me in 4 months. Please call the office at [phone] to schedule your appointment.  BMI:  . If you are age 66 or older, your body mass index should be between 23-30. Your There is no height or weight on file to calculate BMI. If this is out of the aforementioned range listed, please consider follow up with your Primary Care Provider.  Thank you for trusting me with your gastrointestinal care!    Thornton Park, MD, MPH

## 2021-05-30 NOTE — Progress Notes (Signed)
Please enter orders for PAT visit scheduled for 06-13-21

## 2021-05-31 ENCOUNTER — Ambulatory Visit: Payer: Self-pay | Admitting: Surgery

## 2021-06-12 ENCOUNTER — Encounter: Payer: Self-pay | Admitting: Gastroenterology

## 2021-06-12 ENCOUNTER — Ambulatory Visit: Payer: Medicare Other | Admitting: Gastroenterology

## 2021-06-12 VITALS — BP 130/84 | HR 59 | Ht 66.0 in | Wt 147.4 lb

## 2021-06-12 DIAGNOSIS — K52831 Collagenous colitis: Secondary | ICD-10-CM

## 2021-06-12 NOTE — Progress Notes (Signed)
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 11/29/2020   - location of polyp makes endoscopic resection difficult    - awaiting surgical consultation this summer to discuss resection Failed colon prep 2016 due to retained stool Tortuous colon mentioned on prior colonoscopy report Pancolonic diverticulosis    PLAN: - Reduce Entocort '6mg'$  daily with goal to taper to 3 mg daily at Halloween if symptoms remain well controlled - Continue Bentyl 10 mg QID PRN, particularly prior to meals - Use Imodium PRN - Continue daily probiotics - Follow-up in 6 months, earlier if needed  I spent over 30 minutes, including in depth chart review, independent review of results, communicating results with the patient directly, face-to-face time with the patient, coordinating care, and ordering studies and medications as appropriate, and documentation.     HPI: JAIRY GILHOOLEY is a 75 y.o. female who returns in follow-up. She was initially referred by Dr. Ouida Sills initially for a positive Cologaurd. She has a history of non-bloody, predominantly post-prandial diarrhea with associated urgency that developed immediately after her colonoscopy in 2016. Thyroid, infectious stools studies, and celiac labs were negative.  She noticed that the avoidance of caffeine-containing products such as coffee and chocolate improved her symptoms. Lactose-free and gluten-free diet had been ineffective. Prior GI recommended a low FODMAP diet and dicyclomine, although she does not remember taking it.  Wearing Depends to avoid accidents that occur when she least expects them. No specific identified triggers.   Colonoscopy 11/29/20 for a positive Cologuard revealed a tubular adenoma at the IC valve and collagenous colitis.  A hyperplastic polyp was removed from the sigmoid colon.  Bowel habits have normalized following her colonoscopy using Entocort 9 mg daily. She is having 1-2 soft, but overall formed, bowel movements daily. Having a main bowel movement each morning, often with an afternoon bowel movements. Has also been adjusting her diet to avoid triggering foods. There are no nocturnal symptoms, abdominal pain, weight loss, fecal urgency, or fecal incontinence. GI ROS is otherwise negative. Has used Imodium once since her colonoscopy.    Recent cat bite while on antibiotics her bowel frequency increased.  Endoscopic history: - Has had 3 colonoscopies. The first two with Dr. Denice Paradise and small polyps were removed.  She does not believe these were precancerous polyps.  The procedures were performed without incident.   - Surveillance colonoscopy with Dr. Gustavo Lah was attempted 06/26/2015 but could not be performed due to poor prep.  Was successfully completed 06/27/2015 for personal history of colon polyps revealed 2 small rectosigmoid hyperplastic polyps, pancolonic diverticulosis, and a redundant sigmoid colon.  - Colonoscopy 11/29/20 revealed a tubular adenoma at the IC valve and collagenous colitis.  A hyperplastic polyp was removed from the sigmoid colon. 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     Past Surgical History:  Procedure Laterality Date   CATARACT EXTRACTION Bilateral  COLONOSCOPY WITH PROPOFOL N/A 06/26/2015   Procedure: COLONOSCOPY WITH PROPOFOL;  Surgeon: Lollie Sails, MD;  Location: [facility] ENDOSCOPY;  Service: Endoscopy;  Laterality: N/A;    COLONOSCOPY WITH PROPOFOL N/A 06/27/2015   Procedure: COLONOSCOPY WITH PROPOFOL;  Surgeon: Lollie Sails, MD;  Location: [facility] ENDOSCOPY;  Service: Endoscopy;  Laterality: N/A;   dental implants     REFRACTIVE SURGERY     TONSILLECTOMY      Current Outpatient Medications  Medication Sig Dispense Refill   Ascorbic Acid 500 MG CHEW Chew 2 tablets by mouth daily.     atorvastatin (LIPITOR) 20 MG tablet Take 20 mg by mouth daily.     budesonide (ENTOCORT EC) 3 MG 24 hr capsule Take 3 capsules (9 mg total) by mouth daily. 90 capsule 3   Calcium Carbonate (CALCIUM 600 PO) Take 2 capsules by mouth daily.     Cholecalciferol 2000 UNITS CAPS Take 4,000 Units by mouth every morning.     citalopram (CELEXA) 10 MG tablet Take 10 mg by mouth daily.     Multiple Vitamins-Minerals (CENTRUM SILVER PO) Take by mouth.     Probiotic Product (PROBIOTIC PO) Take 2 tablets by mouth daily. Physician's Choice     No current facility-administered medications for this visit.    Allergies as of 06/12/2021   (No Known Allergies)    Family History  Problem Relation Age of Onset   Colon cancer Neg Hx    Esophageal cancer Neg Hx    Rectal cancer Neg Hx    Stomach cancer Neg Hx       Physical Exam: General:   Alert,  well-nourished, pleasant and cooperative in NAD Head:  Normocephalic and atraumatic. Eyes:  Sclera clear, no icterus.   Conjunctiva pink. Abdomen:  Soft, nontender, nondistended, normal bowel sounds, no rebound or guarding. No hepatosplenomegaly.   Neurologic:  Alert and  oriented x4;  grossly nonfocal Skin:  Intact without significant lesions or rashes. Psych:  Alert and cooperative. Normal mood and affect.    Watson Robarge L. Tarri Glenn, MD, MPH 06/12/2021, 11:08 AM

## 2021-06-12 NOTE — Patient Instructions (Addendum)
It was my pleasure to provide care to you today. Based on our discussion, I am providing you with my recommendations below:  RECOMMENDATION(S):   Please continue to taper Entocort daily Continue Bentyl as needed Continue Imodium as needed Continue daily probiotics  FOLLOW UP:  I would like for you to follow up with me in 6 months. Please call the office at [phone] to schedule your appointment.  BMI:  If you are age 75 or older, your body mass index should be between 23-30. Your Body mass index is 23.79 kg/m. If this is out of the aforementioned range listed, please consider follow up with your Primary Care Provider.  MY CHART:  The [place] [facility] providers would like to encourage you to use [facility] to communicate with providers for non-urgent requests or questions.  Due to long hold times on the telephone, sending your provider a message by [facility] may be a faster and more efficient way to get a response.  Please allow 48 business hours for a response.  Please remember that this is for non-urgent requests.   Thank you for trusting me with your gastrointestinal care!    Thornton Park, MD, MPH

## 2021-06-13 ENCOUNTER — Encounter (HOSPITAL_COMMUNITY): Payer: Medicare Other

## 2021-06-21 ENCOUNTER — Ambulatory Visit: Admit: 2021-06-21 | Payer: Medicare Other | Admitting: Surgery

## 2021-06-21 SURGERY — APPENDECTOMY, LAPAROSCOPIC
Anesthesia: General

## 2021-08-07 ENCOUNTER — Other Ambulatory Visit: Payer: Self-pay | Admitting: Gastroenterology

## 2021-08-07 DIAGNOSIS — K52831 Collagenous colitis: Secondary | ICD-10-CM

## 2021-11-14 ENCOUNTER — Encounter: Payer: Self-pay | Admitting: Emergency Medicine

## 2021-11-14 ENCOUNTER — Other Ambulatory Visit: Payer: Self-pay

## 2021-11-14 ENCOUNTER — Ambulatory Visit
Admission: EM | Admit: 2021-11-14 | Discharge: 2021-11-14 | Disposition: A | Discharge status: Home / Self Care | Payer: Medicare Other | Attending: Emergency Medicine | Admitting: Emergency Medicine

## 2021-11-14 DIAGNOSIS — H6123 Impacted cerumen, bilateral: Secondary | ICD-10-CM

## 2021-11-14 NOTE — ED Triage Notes (Signed)
Pt presents with bilateral ears clogged for several weeks

## 2021-11-14 NOTE — Discharge Instructions (Addendum)
Follow-up with your primary care provider as needed.

## 2021-11-14 NOTE — ED Provider Notes (Signed)
Roderic Palau    CSN: [id number] Arrival date & time: 11/14/21  1432      History   Chief Complaint Chief Complaint  Patient presents with   Clogged Ears     HPI Molly [name] is a 76 y.o. female.  Patient presents with stopped up feeling in bilateral ears and muffled hearing for several weeks.  Treatment attempted at home with Debrox.  She denies fever, chills, ear drainage, ear pain, sore throat, cough, difficulty breathing, or other symptoms.  The history is provided by the patient and medical records.   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      Home Medications    Prior to Admission medications   Medication Sig Start Date End Date Taking? Authorizing Provider  Ascorbic Acid 500 MG CHEW Chew 2 tablets by mouth daily. 07/01/19   [provider]  atorvastatin (LIPITOR) 20 MG tablet Take 20 mg by mouth daily. 11/08/20   [provider]  budesonide (ENTOCORT EC) 3 MG 24 hr capsule TAKE 3 CAPSULES BY MOUTH DAILY 08/07/21   Thornton Park, MD  Calcium Carbonate (CALCIUM 600 PO) Take 2 capsules by mouth daily.    [provider]  Cholecalciferol 2000 UNITS CAPS Take 4,000 Units by mouth every morning.    [provider]  citalopram (CELEXA) 10 MG tablet Take 10 mg by mouth daily.    [provider]  Multiple Vitamins-Minerals (CENTRUM SILVER PO) Take by mouth.    [provider]  Probiotic Product (PROBIOTIC PO) Take 2 tablets by mouth daily. Physician's Choice    [provider]    Family History Family History  Problem Relation Age of Onset   Colon cancer Neg Hx    Esophageal cancer Neg Hx    Rectal cancer Neg Hx    Stomach cancer Neg Hx     Social History Social History   Tobacco Use   Smoking status: Every Day    Types: Cigarettes   Smokeless tobacco: Never  Vaping Use   Vaping Use: Never used  Substance Use Topics   Alcohol use: Yes    Alcohol/week: 1.0 standard drink    Types: 1 Glasses of wine per week    Comment: 2-3 times/week   Drug use: Never     Allergies   Patient has no known allergies.   Review of Systems Review of Systems  Constitutional:  Negative for chills and fever.  HENT:  Positive for hearing loss. Negative for ear discharge, ear pain and sore throat.   Respiratory:  Negative for cough and shortness of breath.   Cardiovascular:  Negative for chest pain and palpitations.  Gastrointestinal:  Negative for vomiting.  Skin:  Negative for color change and rash.  All other systems reviewed and are negative.   Physical Exam Triage Vital Signs ED Triage Vitals  Enc Vitals Group     BP      Pulse      Resp      Temp      Temp src      SpO2      Weight      Height      Head Circumference      Peak Flow      Pain Score      Pain Loc      Pain Edu?      Excl. in [place]?    No data found.  Updated Vital Signs BP 113/69    Pulse 71    Temp 97.9 F (36.6 C)    Resp 18    SpO2 94%   Visual Acuity Right Eye Distance:   Left Eye Distance:   Bilateral Distance:    Right Eye Near:   Left Eye Near:    Bilateral Near:      Physical Exam Vitals and nursing note reviewed.  Constitutional:      General: She is not in acute distress.    Appearance: She is well-developed. She is not ill-appearing.  HENT:     Right Ear: There is impacted cerumen.     Left Ear: There is impacted cerumen.     Mouth/Throat:     Mouth: Mucous membranes are moist.  Cardiovascular:     Rate and Rhythm: Normal rate and regular rhythm.     Heart sounds: Normal heart sounds.  Pulmonary:     Effort: Pulmonary effort is normal. No respiratory distress.     Breath sounds: Normal breath sounds.  Musculoskeletal:     Cervical back: Neck supple.  Skin:    General: Skin is warm and dry.  Neurological:     Mental Status: She is alert.  Psychiatric:        Mood and Affect: Mood normal.        Behavior: Behavior normal.     UC Treatments / Results  Labs (all labs ordered are listed, but only abnormal results are displayed) Labs Reviewed - No data to display  EKG   Radiology No results found.  Procedures Procedures (including critical care time)  Medications Ordered in UC Medications - No data to display  Initial Impression / Assessment and Plan / UC Course  I have reviewed the triage vital signs and the nursing notes.  Pertinent labs & imaging results that were available during my care of the patient were reviewed by me and considered in my medical decision making (see chart for details).   Bilateral cerumen impaction.  Cerumen removed via irrigation by RN.  Patient reports complete relief of symptoms.  TMs noted to be clear after cerumen removal.  Instructed patient to follow up with her PCP as needed.  She agrees to plan of care.     Final Clinical Impressions(s) / UC Diagnoses   Final diagnoses:  Bilateral impacted cerumen     Discharge Instructions      Follow up with your primary care provider as needed.        ED Prescriptions   None    PDMP not reviewed this encounter.   Hall Busing,  Fredderick Phenix,  NP 11/14/21 1514

## 2021-12-06 ENCOUNTER — Ambulatory Visit
Admission: EM | Admit: 2021-12-06 | Discharge: 2021-12-06 | Disposition: A | Discharge status: Home / Self Care | Payer: Medicare Other

## 2021-12-06 ENCOUNTER — Other Ambulatory Visit: Payer: Self-pay

## 2021-12-06 ENCOUNTER — Encounter: Payer: Self-pay | Admitting: Emergency Medicine

## 2021-12-06 DIAGNOSIS — H6981 Other specified disorders of Eustachian tube, right ear: Secondary | ICD-10-CM | POA: Diagnosis not present

## 2021-12-06 NOTE — Progress Notes (Signed)
?Charlann Boxer D.O. ?[facility] ?[place] ?Phone: [phone] ?Subjective:   ?I, Jacqualin Combes, am serving as a scribe for Dr. Hulan Saas. ?This visit occurred during the SARS-CoV-2 public health emergency.  Safety protocols were in place, including screening questions prior to the visit, additional usage of staff PPE, and extensive cleaning of exam room while observing appropriate contact time as indicated for disinfecting solutions.  ?I'm seeing this patient by the request  of:  [name], MD ? ?CC: Low back pain follow-up ? ?[id number]  ?Last seen 2019 for left hip pain ? ?Molly [name] is a 76 y.o. female coming in with complaint of neck  and lumbar spine pain. States that she has been having tightness in cervical and lumbar spine on L side. Pain will radiate up into neck. Does not have any radiating symptoms in lower body. Patient has been getting massages and using Arnica lotion.  ? ? ?  ? ?Past Medical History:  ?Diagnosis Date  ? Anxiety   ? Cataract   ? removed bilatarly  ? Headache   ? High cholesterol   ? 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 ?Past Surgical History:  ?Procedure Laterality Date  ? CATARACT EXTRACTION Bilateral   ? COLONOSCOPY WITH PROPOFOL N/A 06/26/2015  ? Procedure: COLONOSCOPY WITH PROPOFOL;  Surgeon: Lollie Sails, MD;  Location: [facility] ENDOSCOPY;  Service: Endoscopy;  Laterality: N/A;  ? COLONOSCOPY WITH PROPOFOL N/A 06/27/2015  ? Procedure: COLONOSCOPY WITH PROPOFOL;  Surgeon: Lollie Sails, MD;  Location: [facility] ENDOSCOPY;  Service: Endoscopy;  Laterality: N/A;  ? dental implants    ? REFRACTIVE SURGERY    ? TONSILLECTOMY    ? ?Social History  ? ?Socioeconomic History  ? Marital status: Married  ?  Spouse name: Not on file  ? Number of children: Not on file  ? Years of education: Not on file  ? Highest education level: Not on file  ?Occupational History  ? Not on file  ?Tobacco Use  ? Smoking  status: Every Day  ?  Types: Cigarettes  ? Smokeless tobacco: Never  ?Vaping Use  ? Vaping Use: Never used  ?Substance and Sexual Activity  ? Alcohol use: Yes  ?  Alcohol/week: 1.0 standard drink  ?  Types: 1 Glasses of wine per week  ?  Comment: 2-3 times/week  ? Drug use: Never  ? Sexual activity: Not on file  ?Other Topics Concern  ? Not on file  ?Social History Narrative  ? Not on file  ? ?Social Determinants of Health  ? ?Financial Resource Strain: Not on file  ?Food Insecurity: Not on file  ?Transportation Needs: Not on file  ?Physical Activity: Not on file  ?Stress: Not on file  ?Social Connections: Not on file  ? ?No Known Allergies ?Family History  ?Problem Relation Age of Onset  ? Colon cancer Neg Hx   ? Esophageal cancer Neg Hx   ? Rectal cancer Neg Hx   ? Stomach cancer Neg Hx   ? ? ?Current Outpatient Medications (Endocrine & Metabolic):  ?  budesonide (ENTOCORT EC) 3 MG 24 hr capsule, TAKE 3 CAPSULES BY MOUTH DAILY (Patient taking differently: Take 3 mg by mouth daily.) ? ?Current Outpatient Medications (Cardiovascular):  ?  atorvastatin (LIPITOR) 20 MG tablet, Take 20 mg by mouth daily. ? ? ? ? ?Current Outpatient Medications (Other):  ?  Ascorbic Acid 500 MG CHEW, Chew 500  mg by mouth daily. ?  Cholecalciferol 2000 UNITS CAPS, Take 4,000 Units by mouth every morning. ?  citalopram (CELEXA) 10 MG tablet, Take 10 mg by mouth daily. ?  Multiple Vitamins-Minerals (CENTRUM SILVER PO), Take 1 tablet by mouth daily. ?  Probiotic Product (PROBIOTIC PO), Take 1 tablet by mouth daily. Physician's Choice ? ? ?Reviewed prior external information including notes and imaging from  ?primary care provider ?As well as notes that were available from care everywhere and other healthcare systems. ? ?Past medical history, social, surgical and family history all reviewed in electronic medical record.  No pertanent information unless stated regarding to the chief complaint.  ? ?Review of Systems: ? No headache, visual  changes, nausea, vomiting, diarrhea, constipation, dizziness, abdominal pain, skin rash, fevers, chills, night sweats, weight loss, swollen lymph nodes, body aches, joint swelling, chest pain, shortness of breath, mood changes. POSITIVE muscle aches ? ?Objective  ?Blood pressure 118/90, pulse 67, height '5\' 6"'$  (1.676 m), weight 150 lb (68 kg), SpO2 95 %. ?  ?General: No apparent distress alert and oriented x3 mood and affect normal, dressed appropriately.  ?HEENT: Pupils equal, extraocular movements intact  ?Respiratory: Patient's speak in full sentences and does not appear short of breath  ?Cardiovascular: No lower extremity edema, non tender, no erythema  ?Gait normal with good balance and coordination.  ?MSK: Low back exam does have some loss of lordosis.  Some tenderness to palpation in the paraspinal musculature.  Mild scoliosis noted.  Patient does have tightness in the upper thoracic area with some mild scapular dyskinesis as well. ? ?Osteopathic findings ? ?C6 flexed rotated and side bent left ?T3 extended rotated and side bent right inhaled third rib ?T9 extended rotated and side bent left ?L2 flexed rotated and side bent right ?L5 flexed rotated and side bent left ?Sacrum right on right ? ?[id number]; 15 additional minutes spent for Therapeutic exercises as stated in above notes.  This included exercises focusing on stretching, strengthening, with significant focus on eccentric aspects.   Long term goals include an improvement in range of motion, strength, endurance as well as avoiding reinjury. Patient's frequency would include in 1-2 times a day, 3-5 times a week for a duration of 6-12 weeks. Low back exercises that included:  ?Pelvic tilt/bracing instruction to focus on control of the pelvic girdle and lower abdominal muscles  ?Glute strengthening exercises, focusing on proper firing of the glutes without engaging the low back muscles ?Proper stretching techniques for maximum relief for the hamstrings, hip  flexors, low back and some rotation where tolerated ? Proper technique shown and discussed handout in great detail with ATC.  All questions were discussed and answered.  ? ?  ?Impression and Recommendations:  ?  ? ?The above documentation has been reviewed and is accurate and complete [name], DO ? ? ? ?

## 2021-12-06 NOTE — ED Triage Notes (Signed)
Pt was seen 2/15 and her right ear was cleaned out. Pt states her ear has been hurting x 3 days  ?

## 2021-12-06 NOTE — Progress Notes (Addendum)
Anesthesia Review: ? ?PCP: ?Cardiologist : ?Chest x-ray : ?EKG : ?Echo : ?Stress test: ?Cardiac Cath :  ?Activity level:  ?Sleep Study/ CPAP : ?Fasting Blood Sugar :      / Checks Blood Sugar -- times a day:   ?Blood Thinner/ Instructions /Last Dose: ?ASA / Instructions/ Last Dose :   ?Orders requested on 12/06/21.  ?No covid test- ambulatory surgery  ?

## 2021-12-06 NOTE — ED Provider Notes (Signed)
?[facility] ? ? ? ?CSN: [id number] ?Arrival date & time: 12/06/21  1219 ? ? ?  ? ?History   ?Chief Complaint ?Chief Complaint  ?Patient presents with  ? Otalgia  ? ? ?HPI ?Molly [name] is a 76 y.o. female.  ? ?HPI ?Patient presents for evaluation of right ear pain which occurs with yawning and blowing her nose. No current pain. No changes in hearing.  ? ?Past Medical History:  ?Diagnosis Date  ? Anxiety   ? Cataract   ? removed bilatarly  ? Headache   ? High cholesterol   ? 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 ? ?Patient Active Problem List  ? Diagnosis Date Noted  ? 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 Arthritis of left hip 10/07/2017  ? ? ?Past Surgical History:  ?Procedure Laterality Date  ? CATARACT EXTRACTION Bilateral   ? COLONOSCOPY WITH PROPOFOL N/A 06/26/2015  ? Procedure: COLONOSCOPY WITH PROPOFOL;  Surgeon: Lollie Sails, MD;  Location[facility] ENDOSCOPY;  Service: Endoscopy;  Laterality: N/A;  ? COLONOSCOPY WITH PROPOFOL N/A 06/27/2015  ? Procedure: COLONOSCOPY WITH PROPOFOL;  Surgeon: Lollie Sails, MD;  Location: [facility] ENDOSCOPY;  Service: Endoscopy;  Laterality: N/A;  ? dental implants    ? REFRACTIVE SURGERY    ? TONSILLECTOMY    ? ? ?OB History   ?No obstetric history on file. ?  ? ? ? ?Home Medications   ? ?Prior to Admission medications   ?Medication Sig Start Date End Date Taking? Authorizing Provider  ?Ascorbic Acid 500 MG CHEW Chew 500 mg by mouth daily. 07/01/19   [provider]  ?atorvastatin (LIPITOR) 20 MG tablet Take 20 mg by mouth daily. 11/08/20   [provider]  ?budesonide (ENTOCORT EC) 3 MG 24 hr capsule TAKE 3 CAPSULES BY MOUTH DAILY ?Patient taking differently: Take 3 mg by mouth daily. 08/07/21   Thornton Park, MD  ?Cholecalciferol 2000 UNITS CAPS Take 4,000 Units by mouth every morning.    [provider]  ?citalopram (CELEXA) 10 MG tablet Take 10 mg by mouth daily.    [provider]  ?Multiple  Vitamins-Minerals (CENTRUM SILVER PO) Take 1 tablet by mouth daily.    [provider]  ?Probiotic Product (PROBIOTIC PO) Take 1 tablet by mouth daily. Physician's Choice    [provider]  ? ? ?Family History ?Family History  ?Problem Relation Age of Onset  ? Colon cancer Neg Hx   ? Esophageal cancer Neg Hx   ? Rectal cancer Neg Hx   ? Stomach cancer Neg Hx   ? ? ?Social History ?Social History  ? ?Tobacco Use  ? Smoking status: Every Day  ?  Types: Cigarettes  ? Smokeless tobacco: Never  ?Vaping Use  ? Vaping Use: Never used  ?Substance Use Topics  ? Alcohol use: Yes  ?  Alcohol/week: 1.0 standard drink  ?  Types: 1 Glasses of wine per week  ?  Comment: 2-3 times/week  ? Drug use: Never  ? ? ? ?Allergies   ?Patient has no known allergies. ? ? ?Review of Systems ?Review of Systems ?Pertinent negatives listed in HPI  ?Physical Exam ?Triage Vital Signs ?ED Triage Vitals [12/06/21 1240]  ?Enc Vitals Group  ?   BP 119/65  ?   Pulse Rate 62  ?   Resp 18  ?   Temp 98.2 ?F (36.8 ?C)  ?   Temp Source Oral  ?   SpO2  94 %  ?   Weight   ?   Height   ?   Head Circumference   ?   Peak Flow   ?   Pain Score   ?   Pain Loc   ?   Pain Edu?   ?   Excl. in [place]?   ? ?No data found. ? ?Updated Vital Signs ?BP 119/65 (BP Location: Left Arm)   Pulse 62   Temp 98.2 ?F (36.8 ?C) (Oral)   Resp 18   SpO2 94%  ? ?Visual Acuity ?Right Eye Distance:   ?Left Eye Distance:   ?Bilateral Distance:   ? ?Right Eye Near:   ?Left Eye Near:    ?Bilateral Near:    ? ?Physical Exam ?General Appearance:    Alert, cooperative, no distress  ?HENT:   ENT exam normal, no neck nodes or sinus tenderness  ?Eyes:    PERRL, conjunctiva/corneas clear, EOM's intact       ?Lungs:     Clear to auscultation bilaterally, respirations unlabored  ?Heart:    Regular rate and rhythm  ?Neurologic:   Awake, alert, oriented x 3. No apparent focal neurological           defect.   ?   ?  ? ?UC Treatments / Results  ?Labs ?(all labs ordered are listed, but  only abnormal results are displayed) ?Labs Reviewed - No data to display ? ?EKG ? ? ?Radiology ?No results found. ? ?Procedures ?Procedures (including critical care time) ? ?Medications Ordered in UC ?Medications - No data to display ? ?Initial Impression / Assessment and Plan / UC Course  ?I have reviewed the triage vital signs and the nursing notes. ? ?Pertinent labs & imaging results that were available during my care of the patient were reviewed by me and considered in my medical decision making (see chart for details). ? ?  ?ET dysfunction right ?Recommended antihistamine. ?No infection. Follow-up with PCP as needed ?Final Clinical Impressions(s) / UC Diagnoses  ? ?Final diagnoses:  ?Eustachian tube dysfunction, right  ? ?Discharge Instructions   ?None ?  ? ?ED Prescriptions   ?None ?  ? ?PDMP not reviewed this encounter. ?  ?Scot Jun, FNP ?12/06/21 1423 ? ?

## 2021-12-07 ENCOUNTER — Encounter: Payer: Self-pay | Admitting: Family Medicine

## 2021-12-07 ENCOUNTER — Ambulatory Visit: Payer: Medicare Other | Admitting: Family Medicine

## 2021-12-07 ENCOUNTER — Ambulatory Visit (INDEPENDENT_AMBULATORY_CARE_PROVIDER_SITE_OTHER): Payer: Medicare Other

## 2021-12-07 VITALS — BP 118/90 | HR 67 | Ht 66.0 in | Wt 150.0 lb

## 2021-12-07 DIAGNOSIS — M542 Cervicalgia: Secondary | ICD-10-CM | POA: Diagnosis not present

## 2021-12-07 DIAGNOSIS — M545 Low back pain, unspecified: Secondary | ICD-10-CM | POA: Diagnosis not present

## 2021-12-07 DIAGNOSIS — M9904 Segmental and somatic dysfunction of sacral region: Secondary | ICD-10-CM

## 2021-12-07 DIAGNOSIS — M9902 Segmental and somatic dysfunction of thoracic region: Secondary | ICD-10-CM

## 2021-12-07 DIAGNOSIS — M9908 Segmental and somatic dysfunction of rib cage: Secondary | ICD-10-CM

## 2021-12-07 DIAGNOSIS — M9901 Segmental and somatic dysfunction of cervical region: Secondary | ICD-10-CM

## 2021-12-07 DIAGNOSIS — M9903 Segmental and somatic dysfunction of lumbar region: Secondary | ICD-10-CM | POA: Diagnosis not present

## 2021-12-07 NOTE — Patient Instructions (Addendum)
Xray today ?Tried manipulation  ?Tart cherry extract '1200mg'$  at night ?See me in 6 weeks ?

## 2021-12-07 NOTE — Progress Notes (Signed)
Sent message, via epic in basket, requesting orders in epic from surgeon.  

## 2021-12-08 DIAGNOSIS — M545 Low back pain, unspecified: Secondary | ICD-10-CM | POA: Insufficient documentation

## 2021-12-08 DIAGNOSIS — M9903 Segmental and somatic dysfunction of lumbar region: Secondary | ICD-10-CM | POA: Insufficient documentation

## 2021-12-08 NOTE — Assessment & Plan Note (Signed)
? ?  Decision today to treat with OMT was based on Physical Exam  After verbal consent patient was treated with HVLA, ME, FPR techniques in cervical, thoracic, rib, lumbar and sacral areas, all areas are chronic   Patient tolerated the procedure well with improvement in symptoms  Patient given exercises, stretches and lifestyle modifications  See medications in patient instructions if given  Patient will follow up in 6-8 weeks 

## 2021-12-08 NOTE — Assessment & Plan Note (Signed)
Patient's low back pain is likely multifactorial.  Do believe there is underlying arthritis as well as muscle instability.  Patient likely will do well with conservative therapy.  Given home exercises, we discussed formal physical therapy but patient declined at the moment.  Started osteopathic manipulation.  Increase activity slowly.  Follow-up again in 6 to 8 weeks ?

## 2021-12-10 ENCOUNTER — Encounter (HOSPITAL_COMMUNITY)
Admission: RE | Admit: 2021-12-10 | Discharge: 2021-12-10 | Disposition: A | Discharge status: Home / Self Care | Payer: Medicare Other | Source: Ambulatory Visit | Attending: Surgery | Admitting: Surgery

## 2021-12-10 ENCOUNTER — Encounter (HOSPITAL_COMMUNITY): Payer: Self-pay

## 2021-12-10 ENCOUNTER — Other Ambulatory Visit: Payer: Self-pay

## 2021-12-10 ENCOUNTER — Ambulatory Visit: Payer: Self-pay | Admitting: Surgery

## 2021-12-10 DIAGNOSIS — Z01812 Encounter for preprocedural laboratory examination: Secondary | ICD-10-CM | POA: Insufficient documentation

## 2021-12-10 DIAGNOSIS — Z01818 Encounter for other preprocedural examination: Secondary | ICD-10-CM

## 2021-12-10 HISTORY — DX: Unspecified osteoarthritis, unspecified site: M19.90

## 2021-12-10 LAB — COMPREHENSIVE METABOLIC PANEL
ALT: 21 U/L (ref 0–44)
AST: 25 U/L (ref 15–41)
Albumin: 4.4 g/dL (ref 3.5–5.0)
Alkaline Phosphatase: 59 U/L (ref 38–126)
Anion gap: 10 (ref 5–15)
BUN: 17 mg/dL (ref 8–23)
CO2: 25 mmol/L (ref 22–32)
Calcium: 9.4 mg/dL (ref 8.9–10.3)
Chloride: 105 mmol/L (ref 98–111)
Creatinine, Ser: 0.72 mg/dL (ref 0.44–1.00)
GFR, Estimated: >60 mL/min (ref >60)
Glucose, Bld: 80 mg/dL (ref 70–99)
Potassium: 4.6 mmol/L (ref 3.5–5.1)
Sodium: 140 mmol/L (ref 135–145)
Total Bilirubin: 0.5 mg/dL (ref 0.3–1.2)
Total Protein: 7.1 g/dL (ref 6.5–8.1)

## 2021-12-10 LAB — CBC WITH DIFFERENTIAL/PLATELET
Abs Immature Granulocytes: 0.03 10*3/uL (ref 0.00–0.07)
Basophils Absolute: 0.1 10*3/uL (ref 0.0–0.1)
Basophils Relative: 1 %
Eosinophils Absolute: 0 10*3/uL (ref 0.0–0.5)
Eosinophils Relative: 0 %
HCT: 43.2 % (ref 36.0–46.0)
Hemoglobin: 14.3 g/dL (ref 12.0–15.0)
Immature Granulocytes: 0 %
Lymphocytes Relative: 22 %
Lymphs Abs: 1.8 10*3/uL (ref 0.7–4.0)
MCH: 30.5 pg (ref 26.0–34.0)
MCHC: 33.1 g/dL (ref 30.0–36.0)
MCV: 92.1 fL (ref 80.0–100.0)
Monocytes Absolute: 0.6 10*3/uL (ref 0.1–1.0)
Monocytes Relative: 7 %
Neutro Abs: 5.9 10*3/uL (ref 1.7–7.7)
Neutrophils Relative %: 70 %
Platelets: 336 10*3/uL (ref 150–400)
RBC: 4.69 MIL/uL (ref 3.87–5.11)
RDW: 12.9 % (ref 11.5–15.5)
WBC: 8.4 10*3/uL (ref 4.0–10.5)
nRBC: 0 % (ref 0.0–0.2)

## 2021-12-10 LAB — PROTIME-INR
INR: 1 (ref 0.8–1.2)
Prothrombin Time: 13.6 seconds (ref 11.4–15.2)

## 2021-12-10 NOTE — Patient Instructions (Addendum)
DUE TO COVID-19 ONLY ONE VISITOR  (aged 76 and older)  IS ALLOWED TO COME WITH YOU AND STAY IN THE WAITING ROOM ONLY DURING PRE OP AND PROCEDURE.   **NO VISITORS ARE ALLOWED IN THE SHORT STAY AREA OR RECOVERY ROOM!!**  IF YOU WILL BE ADMITTED INTO THE HOSPITAL YOU ARE ALLOWED ONLY TWO SUPPORT PEOPLE DURING VISITATION HOURS ONLY (7 AM -8PM)   The support person(s) must pass our screening, gel in and out, and wear a mask at all times, including in the patients room. Patients must also wear a mask when staff or their support person are in the room. Visitors GUEST BADGE MUST BE WORN VISIBLY  One adult visitor may remain with you overnight and MUST be in the room by 8 P.M.     You are not required to quarantine, however you are required to wear a well-fitted mask when you are out and around people not in your household.  Hand Hygiene often Do NOT share personal items Notify your provider if you are in close contact with someone who has COVID or you develop fever 100.4 or greater, new onset of sneezing, cough, sore throat, shortness of breath or body aches.  Molly [name], [address], must go inside of the hospital, NOT A DRIVE THRU!  (Must self quarantine after testing. Follow instructions on handout.)       Your procedure is scheduled on: 12-27-21   Report to [facility] Main Entrance    Report to admitting at     Louise AM   Call this number if you have problems the morning of surgery [phone]   Do not eat food :After Midnight.   After Midnight you may have the following liquids until ______ AM/ PM DAY OF SURGERY  Water Black Coffee (sugar ok, NO MILK/CREAM OR CREAMERS)  Tea (sugar ok, NO MILK/CREAM OR CREAMERS) regular and decaf                             Plain Jell-O (NO RED)                                           Fruit ices (not with fruit pulp, NO RED)                                     Popsicles (NO RED)                                                                   Juice: apple, WHITE grape, WHITE cranberry Sports drinks like Gatorade (NO RED) Clear broth(vegetable,chicken,beef)               Drink 2 Ensure/G2 drinks AT 10:00 PM the night before surgery.          1 PRESURGERY DRINK THE DAY OF THE PROCEDURE 3 HOURS PRIOR TO SCHEDULED SURGERY. NO SOLIDS AFTER MIDNIGHT THE DAY PRIOR TO THE SURGERY.  NOTHING BY MOUTH EXCEPT CLEAR LIQUIDS  UNTIL THREE HOURS PRIOR TO SCHEDULED SURGERY. PLEASE FINISH PRESURGERY ENSURE DRINK PER SURGEON ORDER 3 HOURS PRIOR TO SCHEDULED SURGERY TIME WHICH NEEDS TO BE COMPLETED AT __0530 am then nothing by mouth_______.          If you have questions, please contact your surgeons office.   FOLLOW BOWEL PREP AND ANY ADDITIONAL PRE OP INSTRUCTIONS YOU RECEIVED FROM YOUR SURGEON'S OFFICE!!!   After Midnight you may have the following liquids until _0530 __ AM/ DAY OF SURGERY  Water Black Coffee (sugar ok, NO MILK/CREAM OR CREAMERS)  Tea (sugar ok, NO MILK/CREAM OR CREAMERS) regular and decaf Plain Jell-O (NO RED)                             Fruit ices (not with fruit pulp, NO RED) Popsicles (NO RED)                                  Juice: apple, WHITE grape, WHITE cranberry Sports drinks like Gatorade (NO RED) Clear broth(vegetable,chicken,beef)    Sample Menu Breakfast                                Lunch                                     Supper Cranberry juice                    Beef broth                            Chicken broth Jell-O                                     Grape juice                           Apple juice Coffee or tea                        Jell-O                                      Popsicle                                                Coffee or tea                        Coffee or tea  _____________________________________________________________________     Oral Hygiene is also important to reduce your risk of infection.                                     Remember - BRUSH YOUR TEETH THE MORNING OF SURGERY WITH YOUR REGULAR TOOTHPASTE  Do NOT smoke after Midnight   Take these medicines the morning of surgery with A SIP OF WATER: citalopram, Budesonide  DO NOT TAKE ANY ORAL DIABETIC MEDICATIONS DAY OF YOUR SURGERY                                You may not have any metal on your body including hair pins, jewelry, and body piercing             Do not wear make-up, lotions, powders, perfumes/cologne, or deodorant  Do not wear nail polish including gel and S&S, artificial/acrylic nails, or any other type of covering on natural nails including finger and toenails. If you have artificial nails, gel coating, etc. that needs to be removed by a nail salon please have this removed prior to surgery or surgery may need to be canceled/ delayed if the surgeon/ anesthesia feels like they are unable to be safely monitored.   Do not shave  48 hours prior to surgery.                  Do not bring valuables to the hospital. [place].   Contacts, dentures or bridgework may not be worn into surgery.   Bring small overnight bag day of surgery.    Patients discharged on the day of surgery will not be allowed to drive home.  Someone NEEDS to stay with you for the first 24 hours after anesthesia.   Special Instructions: Bring a copy of your healthcare power of attorney and living will documents         the day of surgery if you haven't scanned them before.              Please read over the following fact sheets you were given: IF YOU HAVE QUESTIONS ABOUT YOUR PRE-OP INSTRUCTIONS PLEASE CALL [phone]     [facility] - Preparing for Surgery Before surgery, you can play an important role.  Because skin is not sterile, your skin needs to be as free of germs as possible.  You can reduce the number of germs on your skin by washing with CHG (chlorahexidine gluconate) soap before  surgery.  CHG is an antiseptic cleaner which kills germs and bonds with the skin to continue killing germs even after washing. Please DO NOT use if you have an allergy to CHG or antibacterial soaps.  If your skin becomes reddened/irritated stop using the CHG and inform your nurse when you arrive at Short Stay. Do not shave (including legs and underarms) for at least 48 hours prior to the first CHG shower.  You may shave your face/neck. Please follow these instructions carefully:  1.  Shower with CHG Soap the night before surgery and the  morning of Surgery.  2.  If you choose to wash your hair, wash your hair first as usual with your  normal  shampoo.  3.  After you shampoo, rinse your hair and body thoroughly to remove the  shampoo.                           4.  Use CHG as you would any other liquid soap.  You can apply chg directly  to the skin and wash  Gently with a scrungie or clean washcloth.  5.  Apply the CHG Soap to your body ONLY FROM THE NECK DOWN.   Do not use on face/ open                           Wound or open sores. Avoid contact with eyes, ears mouth and genitals (private parts).                       Wash face,  Genitals (private parts) with your normal soap.             6.  Wash thoroughly, paying special attention to the area where your surgery  will be performed.  7.  Thoroughly rinse your body with warm water from the neck down.  8.  DO NOT shower/wash with your normal soap after using and rinsing off  the CHG Soap.                9.  Pat yourself dry with a clean towel.            10.  Wear clean pajamas.            11.  Place clean sheets on your bed the night of your first shower and do not  sleep with pets. Day of Surgery : Do not apply any lotions/deodorants the morning of surgery.  Please wear clean clothes to the hospital/surgery center.  FAILURE TO FOLLOW THESE INSTRUCTIONS MAY RESULT IN THE CANCELLATION OF YOUR SURGERY PATIENT  SIGNATURE_________________________________  NURSE SIGNATURE__________________________________  ________________________________________________________________________   Molly [name]  An incentive spirometer is a tool that can help keep your lungs clear and active. This tool measures how well you are filling your lungs with each breath. Taking long deep breaths may help reverse or decrease the chance of developing breathing (pulmonary) problems (especially infection) following: A long period of time when you are unable to move or be active. BEFORE THE PROCEDURE  If the spirometer includes an indicator to show your best effort, your nurse or respiratory therapist will set it to a desired goal. If possible, sit up straight or lean slightly forward. Try not to slouch. Hold the incentive spirometer in an upright position. INSTRUCTIONS FOR USE  Sit on the edge of your bed if possible, or sit up as far as you can in bed or on a chair. Hold the incentive spirometer in an upright position. Breathe out normally. Place the mouthpiece in your mouth and seal your lips tightly around it. Breathe in slowly and as deeply as possible, raising the piston or the ball toward the top of the column. Hold your breath for 3-5 seconds or for as long as possible. Allow the piston or ball to fall to the bottom of the column. Remove the mouthpiece from your mouth and breathe out normally. Rest for a few seconds and repeat Steps 1 through 7 at least 10 times every 1-2 hours when you are awake. Take your time and take a few normal breaths between deep breaths. The spirometer may include an indicator to show your best effort. Use the indicator as a goal to work toward during each repetition. After each set of 10 deep breaths, practice coughing to be sure your lungs are clear. If you have an incision (the cut made at the time of surgery), support your incision when coughing by placing a pillow or rolled up towels  firmly against it. Once you are able to get out of bed, walk around indoors and cough well. You may stop using the incentive spirometer when instructed by your caregiver.  RISKS AND COMPLICATIONS Take your time so you do not get dizzy or light-headed. If you are in pain, you may need to take or ask for pain medication before doing incentive spirometry. It is harder to take a deep breath if you are having pain. AFTER USE Rest and breathe slowly and easily. It can be helpful to keep track of a log of your progress. Your caregiver can provide you with a simple table to help with this. If you are using the spirometer at home, follow these instructions: [place] IF:  You are having difficultly using the spirometer. You have trouble using the spirometer as often as instructed. Your pain medication is not giving enough relief while using the spirometer. You develop fever of 100.5 F (38.1 C) or higher. SEEK IMMEDIATE MEDICAL CARE IF:  You cough up bloody sputum that had not been present before. You develop fever of 102 F (38.9 C) or greater. You develop worsening pain at or near the incision site. MAKE SURE YOU:  Understand these instructions. Will watch your condition. Will get help right away if you are not doing well or get worse. Document Released: 01/27/2007 Document Revised: 12/09/2011 Document Reviewed: 03/30/2007 [facility] Patient Information 2014 [place], [place].   ________________________________________________________________________

## 2021-12-10 NOTE — Progress Notes (Addendum)
Anesthesia Review: ? ?[id number] Ouida Sills, MD   [facility] ?Cardiologist :no ? ?Chest x-ray : ?EKG : ?Echo : ?Stress test: ?Cardiac Cath :  ? ?Activity level: Able to walk a flight of stairs without SOB ? ?Sleep Study/ CPAP : ? ?Fasting Blood Sugar :      / Checks Blood Sugar -- times a day:   ? ?Blood Thinner/ Instructions /Last Dose: ?ASA / Instructions/ Last Dose : N/A ?  ?  ? ?No covid test- ambulatory surgery  ?

## 2021-12-11 LAB — HEMOGLOBIN A1C
Hgb A1c MFr Bld: 5.7 % — ABNORMAL HIGH (ref 4.8–5.6)
Mean Plasma Glucose: 117 mg/dL

## 2021-12-24 ENCOUNTER — Ambulatory Visit: Payer: Medicare Other | Admitting: Sports Medicine

## 2021-12-24 NOTE — Progress Notes (Deleted)
? ?   Benito Mccreedy D.Merril Abbe ?[facility] ?[place] ?Phone: [phone] ?  ?Assessment and Plan:   ?  ?There are no diagnoses linked to this encounter.  ?*** ?  ?Pertinent previous records reviewed include *** ?  ?Follow Up: ***  ? ?  ?Subjective:   ?I, Pincus Badder, am serving as a Education administrator for [employer] ? ?Chief Complaint: lumbar back pain  ? ?HPI:  ?12/07/2021 ?Molly [name] is a 76 y.o. female coming in with complaint of neck  and lumbar spine pain. States that she has been having tightness in cervical and lumbar spine on L side. Pain will radiate up into neck. Does not have any radiating symptoms in lower body. Patient has been getting massages and using Arnica lotion.  ?  ?12/24/21 ?Patient states  ? ? ?Relevant Historical Information: *** ? ?Additional pertinent review of systems negative. ? ? ?Current Outpatient Medications:  ?  Ascorbic Acid 500 MG CHEW, Chew 500 mg by mouth daily., Disp: , Rfl:  ?  atorvastatin (LIPITOR) 20 MG tablet, Take 20 mg by mouth daily., Disp: , Rfl:  ?  budesonide (ENTOCORT EC) 3 MG 24 hr capsule, TAKE 3 CAPSULES BY MOUTH DAILY (Patient taking differently: Take 3 mg by mouth daily.), Disp: 90 capsule, Rfl: 3 ?  Cholecalciferol 2000 UNITS CAPS, Take 4,000 Units by mouth every morning., Disp: , Rfl:  ?  citalopram (CELEXA) 10 MG tablet, Take 10 mg by mouth daily., Disp: , Rfl:  ?  Multiple Vitamins-Minerals (CENTRUM SILVER PO), Take 1 tablet by mouth daily., Disp: , Rfl:  ?  Probiotic Product (PROBIOTIC PO), Take 1 tablet by mouth daily. Physician's Choice, Disp: , Rfl:   ? ?Objective:   ?  ?There were no vitals filed for this visit.  ?  ?There is no height or weight on file to calculate BMI.  ?  ?Physical Exam:   ? ?*** ? ? ?Electronically signed by:  ?Benito Mccreedy D.Merril Abbe ?[facility] ?10:53 AM 12/24/21 ?

## 2022-01-18 ENCOUNTER — Ambulatory Visit: Payer: Medicare Other | Admitting: Family Medicine

## 2022-01-18 VITALS — BP 110/82 | HR 70 | Ht 66.0 in | Wt 149.0 lb

## 2022-01-18 DIAGNOSIS — M9902 Segmental and somatic dysfunction of thoracic region: Secondary | ICD-10-CM

## 2022-01-18 DIAGNOSIS — M9904 Segmental and somatic dysfunction of sacral region: Secondary | ICD-10-CM

## 2022-01-18 DIAGNOSIS — M545 Low back pain, unspecified: Secondary | ICD-10-CM | POA: Diagnosis not present

## 2022-01-18 DIAGNOSIS — M9908 Segmental and somatic dysfunction of rib cage: Secondary | ICD-10-CM

## 2022-01-18 DIAGNOSIS — M9901 Segmental and somatic dysfunction of cervical region: Secondary | ICD-10-CM | POA: Diagnosis not present

## 2022-01-18 DIAGNOSIS — M9903 Segmental and somatic dysfunction of lumbar region: Secondary | ICD-10-CM | POA: Diagnosis not present

## 2022-01-18 NOTE — Assessment & Plan Note (Signed)
Low back pain still have tightness  ?Still respond to OMT and discussed core strengthening exercises again.  Follow-up again in 6 to 8 weeks.  Responded well to osteopathic manipulation. ?

## 2022-01-18 NOTE — Patient Instructions (Signed)
[place] to buddy tape fingers ?You are the perfect patient ?See you again in 2 months ?

## 2022-01-18 NOTE — Progress Notes (Signed)
?Charlann Boxer D.O. ?[facility] ?[place] ?Phone: [phone] ?Subjective:   ? ?I'm seeing this patient by the request  of:  [name], MD ? ?CC: Low back pain follow-up ? ?[id number]  ?Molly [name] is a 76 y.o. female coming in with complaint of back and neck pain. OMT 12/07/2021. Patient states that she is going better. Patient notes tightness in L trap. She was sick and feels that the cough caused her neck pain to get worse.  ? ?Also notes pain in R hand 2nd finger over extensor tendon. Patient has been doing a lot of weeding and floor waxing. No history of hand pain.  Using [employer] for hands.  ? ?Medications patient has been prescribed:  ? ?Taking: ? ? ?  ? ? ? ? ?Reviewed prior external information including notes and imaging from previsou exam, outside providers and external EMR if available.  ? ?As well as notes that were available from care everywhere and other healthcare systems. ? ?Past medical history, social, surgical and family history all reviewed in electronic medical record.  No pertanent information unless stated regarding to the chief complaint.  ? ?Past Medical History:  ?Diagnosis Date  ? Anxiety   ? Arthritis   ? mild back and neck  ? Cataract   ? removed bilatarly  ? Headache   ? High cholesterol   ? 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  ?No Known Allergies ? ? ?Review of Systems: ? No headache, visual changes, nausea, vomiting, diarrhea, constipation, dizziness, abdominal pain, skin rash, fevers, chills, night sweats, weight loss, swollen lymph nodes, body aches, joint swelling, chest pain, shortness of breath, mood changes. POSITIVE muscle aches ? ?Objective  ?Blood pressure 110/82, pulse 70, height '5\' 6"'$  (1.676 m), weight 149 lb (67.6 kg), SpO2 98 %. ?  ?General: No apparent distress alert and oriented x3 mood and affect normal, dressed appropriately.  ?HEENT: Pupils equal, extraocular movements intact   ?Respiratory: Patient's speak in full sentences and does not appear short of breath  ?Cardiovascular: No lower extremity edema, non tender, no erythema  ?Low back exam does have some loss of lordosis.  Patient does have some tightness noted mostly in the sacral sacral joint.  Mild tightness noted in the scapular region as well. ? ?Osteopathic findings ? ?C2 flexed rotated and side bent right ?C5 flexed rotated and side bent left ?T5 extended rotated and side bent right inhaled rib ?T10 extended rotated and side bent left ?L2 flexed rotated and side bent right ?Sacrum right on right ? ? ? ? ?  ?Assessment and Plan: ? ?Low back pain ?Low back pain still have tightness  ?Still respond to OMT and discussed core strengthening exercises again.  Follow-up again in 6 to 8 weeks.  Responded well to osteopathic manipulation.  ? ?Nonallopathic problems ? ?Decision today to treat with OMT was based on Physical Exam ? ?After verbal consent patient was treated with HVLA, ME, FPR techniques in cervical, rib, thoracic, lumbar, and sacral  areas ? ?Patient tolerated the procedure well with improvement in symptoms ? ?Patient given exercises, stretches and lifestyle modifications ? ?See medications in patient instructions if given ? ?Patient will follow up in 4-8 weeks ? ?  ? ? ?The above documentation has been reviewed and is accurate and complete [name], DO ? ? ? ?  ? ? Note: This dictation was prepared with Dragon dictation along with  smaller phrase technology. Any transcriptional errors that result from this process are unintentional.    ?  ?  ? ? ?

## 2022-01-18 NOTE — Patient Instructions (Addendum)
DUE TO COVID-19 ONLY TWO VISITORS  (aged 76 and older)  IS ALLOWED TO COME WITH YOU AND STAY IN THE WAITING ROOM ONLY DURING PRE OP AND PROCEDURE.   ?**NO VISITORS ARE ALLOWED IN THE SHORT STAY AREA OR RECOVERY ROOM!!** ? ?IF YOU WILL BE ADMITTED INTO THE HOSPITAL YOU ARE ALLOWED ONLY FOUR SUPPORT PEOPLE DURING VISITATION HOURS ONLY (7 AM -8PM)   ?The support person(s) must pass our screening, gel in and out ?Visitors GUEST BADGE MUST BE WORN VISIBLY  ?One adult visitor may remain with you overnight and MUST be in the room by 8 P.M.  ? ?You are not required to quarantine ?Hand Hygiene often ?Do NOT share personal items ?Notify your provider if you are in close contact with someone who has COVID or you develop fever 100.4 or greater, new onset of sneezing, cough, sore throat, shortness of breath or body aches. ? ?     ? Your procedure is scheduled on:  02-07-22 ? ? Report to [facility] Main Entrance ? ?  Report to admitting at 5:15 AM ? ? Call this number if you have problems the morning of surgery [phone] ? ? Follow a clear liquid diet day of prep to avoid dehydration ? ? After Midnight you may have the following liquids until 4:30 AM DAY OF SURGERY ? ?Water ?Black Coffee (sugar ok, NO MILK/CREAM OR CREAMERS)  ?Tea (sugar ok, NO MILK/CREAM OR CREAMERS) regular and decaf                             ?Plain Jell-O (NO RED)                                           ?Fruit ices (not with fruit pulp, NO RED)                                     ?Popsicles (NO RED)                                                                  ?Juice: apple, WHITE grape, WHITE cranberry ?Sports drinks like Gatorade (NO RED) ?Clear broth(vegetable,chicken,beef) ? ? ?Drink two Pre Surgery Clear Ensure drinks the night before surgery, have completed by 10 PM             ?  ?  ?The day of surgery:  ?Drink ONE (1) Pre-Surgery Clear Ensure at 4:30  AM the morning of surgery. Drink in one sitting. Do not sip.  ?This drink was given  to you during your hospital  ?pre-op appointment visit. ?Nothing else to drink after completing the Pre-Surgery Clear Ensure. ?  ?       If you have questions, please contact your surgeon?s office. ? ? ?FOLLOW BOWEL PREP AND ANY ADDITIONAL PRE OP INSTRUCTIONS YOU RECEIVED FROM YOUR SURGEON'S OFFICE!!! ? ?- Dulcolax, Miralax, Flagyl and Neomycin ? ?Miralax - Mix with 64 oz Gatorade/Powerade.  Drink gradually over the next few hours (8 oz glass every 15-30 minutes)  until gone the day prior to surgery.   ? ?Dulcolax - Give with water the day prior to surgery.  ? ?Flagyl - Take at 2 pm, 3 pm and 10 pm after Miralax bowel prep the day prior to surgery.  Call pharmacy to adjust medication schedule if needed. ?   ?Neomycin Take at  2 pm, 3 pm and 10 pm after Miralax  bowel prep the day prior to surgery.  Call pharmacy to adjust medication schedule if needed. ? ?  ?Oral Hygiene is also important to reduce your risk of infection.                                    ?Remember - BRUSH YOUR TEETH THE MORNING OF SURGERY WITH YOUR REGULAR TOOTHPASTE ? ? Do NOT smoke after Midnight ? ? Take these medicines the morning of surgery with A SIP OF WATER:  Citalopram ?                  ?           You may not have any metal on your body including hair pins, jewelry, and body piercing ? ?           Do not wear make-up, lotions, powders, perfumes or deodorant ? ?Do not wear nail polish including gel and S&S, artificial/acrylic nails, or any other type of covering on natural nails including finger and toenails. If you have artificial nails, gel coating, etc. that needs to be removed by a nail salon please have this removed prior to surgery or surgery may need to be canceled/ delayed if the surgeon/ anesthesia feels like they are unable to be safely monitored.  ? ?Do not shave  48 hours prior to surgery.  ? ? Do not bring valuables to the hospital. [place]. ? ? Contacts, dentures or bridgework may not  be worn into surgery. ? ? Bring small overnight bag day of surgery. ?  ?Patients discharged on the day of surgery will not be allowed to drive home.  Someone NEEDS to stay with you for the first 24 hours after anesthesia. ? ?Special Instructions: Bring a copy of your healthcare power of attorney and living will documents the day of surgery if you haven't scanned them before. ? ?Please read over the following fact sheets you were given: IF [place] [place]  ? ?[place] - Preparing for Surgery ?Before surgery, you can play an important role.  Because skin is not sterile, your skin needs to be as free of germs as possible.  You can reduce the number of germs on your skin by washing with CHG (chlorahexidine gluconate) soap before surgery.  CHG is an antiseptic cleaner which kills germs and bonds with the skin to continue killing germs even after washing. ?Please DO NOT use if you have an allergy to CHG or antibacterial soaps.  If your skin becomes reddened/irritated stop using the CHG and inform your nurse when you arrive at Short Stay. ?Do not shave (including legs and underarms) for at least 48 hours prior to the first CHG shower.  You may shave your face/neck. ? ?Please follow these instructions carefully: ? 1.  Shower with CHG Soap the night before surgery and the  morning of surgery. ? 2.  If you choose to wash your  hair, wash your hair first as usual with your normal  shampoo. ? 3.  After you shampoo, rinse your hair and body thoroughly to remove the shampoo.                            ? 4.  Use CHG as you would any other liquid soap.  You can apply chg directly to the skin and wash.  Gently with a scrungie or clean washcloth. ? 5.  Apply the CHG Soap to your body ONLY FROM THE NECK DOWN.   Do   not use on face/ open      ?                     Wound or open sores. Avoid contact with eyes, ears mouth and   genitals (private parts).  ?                      Production manager,  Genitals (private parts) with your normal soap. ?            6.  Wash thoroughly, paying special attention to the area where your    surgery  will be performed. ? 7.  Thoroughly rinse your body with warm water from the neck down. ? 8.  DO NOT shower/wash with your normal soap after using and rinsing off the CHG Soap. ?               9.  Pat yourself dry with a clean towel. ?           10.  Wear clean pajamas. ?           11.  Place clean sheets on your bed the night of your first shower and do not  sleep with pets. ?Day of Surgery : ?Do not apply any lotions/deodorants the morning of surgery.  Please wear clean clothes to the hospital/surgery center. ? ?FAILURE TO FOLLOW THESE INSTRUCTIONS MAY RESULT IN THE CANCELLATION OF YOUR SURGERY ? ?PATIENT SIGNATURE_________________________________ ? ?NURSE SIGNATURE__________________________________ ? ?________________________________________________________________________  ?  ?WHAT IS A BLOOD TRANSFUSION? Blood Transfusion Information ? ?A transfusion is the replacement of blood or some of its parts. Blood is made up of multiple cells which provide different functions. ?Red blood cells carry oxygen and are used for blood loss replacement. ?White blood cells fight against infection. ?Platelets control bleeding. ?Plasma helps clot blood. ?Other blood products are available for specialized needs, such as hemophilia or other clotting disorders. ?BEFORE THE TRANSFUSION  ?Who gives blood for transfusions?  ?Healthy volunteers who are fully evaluated to make sure their blood is safe. This is blood bank blood. ?Transfusion therapy is the safest it has ever been in the practice of medicine. Before blood is taken from a donor, a complete history is taken to make sure that person has no history of diseases nor engages in risky social behavior (examples are intravenous drug use or sexual activity with multiple partners). The donor's travel history is screened to  minimize risk of transmitting infections, such as malaria. The donated blood is tested for signs of infectious diseases, such as HIV and hepatitis. The blood is then tested to be sure it is compatible with

## 2022-01-18 NOTE — Progress Notes (Addendum)
COVID Vaccine Completed:  Yes x2 ?Date COVID Vaccine completed:  11-06-19 12-01-19 ?Has received booster:  Yes x2 ?COVID vaccine manufacturer: Pfizer     ? ?Date of COVID positive in last 90 days:  No ? ?PCP - Frazier Richards, MD ?Cardiologist -  N/A ? ?Chest x-ray -  N/A ?EKG -  N/A ?Stress Test -  N/A ?ECHO -  N/A ?Cardiac Cath -  N/A ?Pacemaker/ICD device last checked: ?Spinal Cord Stimulator: ? ?Bowel Prep - Clear liquids day of prep.  Bowel prep with Dulcolax, Miralax, Flagyl and Neomycin.  Patient has prep and instructions ? ?Sleep Study - N/A ?CPAP -  ? ?Fasting Blood Sugar - N/A ?Checks Blood Sugar _____ times a day ? ?Blood Thinner Instructions:  N/A ?Aspirin Instructions: ?Last Dose: ? ?Activity level:   Can go up a flight of stairs and perform activities of daily living without stopping and without symptoms of chest pain or shortness of breath.  Able to exercise without symptoms ? ?Anesthesia review:  N/A ? ?Patient denies shortness of breath, fever, cough and chest pain at PAT appointment ? ?Patient verbalized understanding of instructions that were given to them at the PAT appointment. Patient was also instructed that they will need to review over the PAT instructions again at home before surgery.  ?

## 2022-01-21 ENCOUNTER — Other Ambulatory Visit: Payer: Self-pay

## 2022-01-21 ENCOUNTER — Encounter (HOSPITAL_COMMUNITY)
Admission: RE | Admit: 2022-01-21 | Discharge: 2022-01-21 | Disposition: A | Discharge status: Home / Self Care | Payer: Medicare Other | Source: Ambulatory Visit | Attending: Surgery | Admitting: Surgery

## 2022-01-21 ENCOUNTER — Encounter (HOSPITAL_COMMUNITY): Payer: Self-pay

## 2022-01-21 VITALS — BP 151/88 | HR 61 | Temp 98.4°F | Resp 18 | Ht 66.0 in | Wt 145.0 lb

## 2022-01-21 DIAGNOSIS — Z01818 Encounter for other preprocedural examination: Secondary | ICD-10-CM

## 2022-01-21 DIAGNOSIS — Z01812 Encounter for preprocedural laboratory examination: Secondary | ICD-10-CM | POA: Insufficient documentation

## 2022-01-21 HISTORY — DX: Acute upper respiratory infection, unspecified: J06.9

## 2022-01-21 LAB — CBC
HCT: 42.7 % (ref 36.0–46.0)
Hemoglobin: 14.1 g/dL (ref 12.0–15.0)
MCH: 30.5 pg (ref 26.0–34.0)
MCHC: 33 g/dL (ref 30.0–36.0)
MCV: 92.4 fL (ref 80.0–100.0)
Platelets: 316 10*3/uL (ref 150–400)
RBC: 4.62 MIL/uL (ref 3.87–5.11)
RDW: 13.5 % (ref 11.5–15.5)
WBC: 7 10*3/uL (ref 4.0–10.5)
nRBC: 0 % (ref 0.0–0.2)

## 2022-02-06 NOTE — Anesthesia Preprocedure Evaluation (Addendum)
Anesthesia Evaluation  ?Patient identified by MRN, date of birth, ID band ?Patient awake ? ? ? ?Reviewed: ?Allergy & Precautions, NPO status , Patient's Chart, lab work & pertinent test results ? ?Airway ?Mallampati: I ? ?TM Distance: >3 FB ?Neck ROM: Full ? ? ? Dental ?no notable dental hx. ?(+) Implants, Dental Advisory Given, Teeth Intact ?  ?Pulmonary ?Current Smoker and Patient abstained from smoking.,  ?  ?Pulmonary exam normal ?breath sounds clear to auscultation ? ? ? ? ? ? Cardiovascular ?Exercise Tolerance: Good ?Normal cardiovascular exam ?Rhythm:Regular Rate:Normal ? ? ?  ?Neuro/Psych ? Headaches, Anxiety  Neuromuscular disease (chronic back pain)   ? GI/Hepatic ?negative GI ROS, Neg liver ROS,   ?Endo/Other  ?negative endocrine ROS ? Renal/GU ?  ? ?  ?Musculoskeletal ? ?(+) Arthritis ,  ? Abdominal ?  ?Peds ? Hematology ?Lab Results ?     Component                Value               Date                 ?     WBC                      7.0                 01/21/2022           ?     HGB                      14.1                01/21/2022           ?     HCT                      42.7                01/21/2022           ?     MCV                      92.4                01/21/2022           ?     PLT                      316                 01/21/2022           ?   ?Anesthesia Other Findings ? ? Reproductive/Obstetrics ? ?  ? ? ? ? ? ? ? ? ? ? ? ? ? ?  ?  ? ? ? ? ? ? ? ?Anesthesia Physical ?Anesthesia Plan ? ?ASA: 2 ? ?Anesthesia Plan: General  ? ?Post-op Pain Management: Ofirmev IV (intra-op)*  ? ?Induction: Intravenous ? ?PONV Risk Score and Plan: 3 and Treatment may vary due to age or medical condition and Ondansetron ? ?Airway Management Planned: Oral ETT ? ?Additional Equipment: None ? ?Intra-op Plan:  ? ?Post-operative Plan: Extubation in OR ? ?Informed Consent: I have reviewed the patients History and Physical, chart, labs and discussed the procedure including the  risks, benefits and alternatives for the proposed anesthesia with  the patient or authorized representative who has indicated his/her understanding and acceptance.  ? ? ? ?Dental advisory given ? ?Plan Discussed with: CRNA and Anesthesiologist ? ?Anesthesia Plan Comments:   ? ? ? ? ? ?Anesthesia Quick Evaluation ? ?

## 2022-02-07 ENCOUNTER — Ambulatory Visit (HOSPITAL_BASED_OUTPATIENT_CLINIC_OR_DEPARTMENT_OTHER): Payer: Medicare Other | Admitting: Anesthesiology

## 2022-02-07 ENCOUNTER — Ambulatory Visit (HOSPITAL_COMMUNITY): Payer: Medicare Other | Admitting: Anesthesiology

## 2022-02-07 ENCOUNTER — Encounter (HOSPITAL_COMMUNITY)
Admission: RE | Disposition: A | Discharge status: Home / Self Care | Payer: Self-pay | Source: Home / Self Care | Attending: Surgery

## 2022-02-07 ENCOUNTER — Ambulatory Visit (HOSPITAL_COMMUNITY)
Admission: RE | Admit: 2022-02-07 | Discharge: 2022-02-07 | Disposition: A | Discharge status: Home / Self Care | Payer: Medicare Other | Attending: Surgery | Admitting: Surgery

## 2022-02-07 ENCOUNTER — Other Ambulatory Visit: Payer: Self-pay

## 2022-02-07 ENCOUNTER — Encounter (HOSPITAL_COMMUNITY): Payer: Self-pay | Admitting: Surgery

## 2022-02-07 DIAGNOSIS — K52831 Collagenous colitis: Secondary | ICD-10-CM | POA: Insufficient documentation

## 2022-02-07 DIAGNOSIS — D121 Benign neoplasm of appendix: Secondary | ICD-10-CM | POA: Diagnosis not present

## 2022-02-07 DIAGNOSIS — F17210 Nicotine dependence, cigarettes, uncomplicated: Secondary | ICD-10-CM | POA: Insufficient documentation

## 2022-02-07 DIAGNOSIS — E785 Hyperlipidemia, unspecified: Secondary | ICD-10-CM | POA: Insufficient documentation

## 2022-02-07 DIAGNOSIS — Z01818 Encounter for other preprocedural examination: Secondary | ICD-10-CM

## 2022-02-07 DIAGNOSIS — K388 Other specified diseases of appendix: Secondary | ICD-10-CM

## 2022-02-07 HISTORY — PX: LAPAROSCOPIC APPENDECTOMY: SHX408

## 2022-02-07 LAB — TYPE AND SCREEN
ABO/RH(D): A POS
Antibody Screen: NEGATIVE

## 2022-02-07 LAB — ABO/RH: ABO/RH(D): A POS

## 2022-02-07 SURGERY — APPENDECTOMY, LAPAROSCOPIC
Anesthesia: General

## 2022-02-07 MED ORDER — BUPIVACAINE-EPINEPHRINE (PF) 0.25% -1:200000 IJ SOLN
INTRAMUSCULAR | Status: DC | PRN
  Administered 2022-02-07: 30 mL via PERINEURAL

## 2022-02-07 MED ORDER — SUGAMMADEX SODIUM 200 MG/2ML IV SOLN
INTRAVENOUS | Status: DC | PRN
  Administered 2022-02-07: 200 mg via INTRAVENOUS

## 2022-02-07 MED ORDER — PROPOFOL 10 MG/ML IV BOLUS
INTRAVENOUS | Status: DC | PRN
  Administered 2022-02-07: 100 mg via INTRAVENOUS

## 2022-02-07 MED ORDER — LIDOCAINE 2% (20 MG/ML) 5 ML SYRINGE
INTRAMUSCULAR | Status: DC | PRN
  Administered 2022-02-07: 1.5 mg/kg/h via INTRAVENOUS

## 2022-02-07 MED ORDER — LACTATED RINGERS IV SOLN: INTRAVENOUS | Status: DC

## 2022-02-07 MED ORDER — LIDOCAINE 2% (20 MG/ML) 5 ML SYRINGE
INTRAMUSCULAR | Status: DC | PRN
Start: 2022-02-07 — End: 2022-02-07
  Administered 2022-02-07: 60 mg via INTRAVENOUS

## 2022-02-07 MED ORDER — POLYETHYLENE GLYCOL 3350 17 GM/SCOOP PO POWD: 1.0000 | Freq: Once | ORAL | Status: DC

## 2022-02-07 MED ORDER — ONDANSETRON HCL 4 MG/2ML IJ SOLN: 4.0000 mg | Freq: Once | INTRAMUSCULAR | Status: DC | PRN

## 2022-02-07 MED ORDER — ALVIMOPAN 12 MG PO CAPS
12.0000 mg | ORAL_CAPSULE | ORAL | Status: AC
  Administered 2022-02-07: 12 mg via ORAL
  Filled 2022-02-07: qty 1

## 2022-02-07 MED ORDER — CHLORHEXIDINE GLUCONATE 0.12 % MT SOLN
15.0000 mL | Freq: Once | OROMUCOSAL | Status: AC
  Administered 2022-02-07: 15 mL via OROMUCOSAL

## 2022-02-07 MED ORDER — FENTANYL CITRATE (PF) 250 MCG/5ML IJ SOLN
INTRAMUSCULAR | Status: AC
  Filled 2022-02-07: qty 5

## 2022-02-07 MED ORDER — FENTANYL CITRATE (PF) 100 MCG/2ML IJ SOLN
INTRAMUSCULAR | Status: DC | PRN
Start: 2022-02-07 — End: 2022-02-07
  Administered 2022-02-07 (×4): 50 ug via INTRAVENOUS

## 2022-02-07 MED ORDER — ACETAMINOPHEN 500 MG PO TABS
1000.0000 mg | ORAL_TABLET | ORAL | Status: AC
  Administered 2022-02-07: 1000 mg via ORAL
  Filled 2022-02-07: qty 2

## 2022-02-07 MED ORDER — EPHEDRINE SULFATE-NACL 50-0.9 MG/10ML-% IV SOSY
PREFILLED_SYRINGE | INTRAVENOUS | Status: DC | PRN
  Administered 2022-02-07 (×2): 10 mg via INTRAVENOUS

## 2022-02-07 MED ORDER — BUPIVACAINE-EPINEPHRINE (PF) 0.25% -1:200000 IJ SOLN
INTRAMUSCULAR | Status: AC
  Filled 2022-02-07: qty 30

## 2022-02-07 MED ORDER — PROPOFOL 10 MG/ML IV BOLUS
INTRAVENOUS | Status: AC
Start: 2022-02-07 — End: ?
  Filled 2022-02-07: qty 20

## 2022-02-07 MED ORDER — TRAMADOL HCL 50 MG PO TABS: 50.0000 mg | ORAL_TABLET | Freq: Four times a day (QID) | ORAL | 0 refills | Status: AC | PRN

## 2022-02-07 MED ORDER — SODIUM CHLORIDE 0.9 % IR SOLN
Status: DC | PRN
  Administered 2022-02-07: 1000 mL

## 2022-02-07 MED ORDER — NEOMYCIN SULFATE 500 MG PO TABS: 1000.0000 mg | ORAL_TABLET | ORAL | Status: DC

## 2022-02-07 MED ORDER — LACTATED RINGERS IR SOLN
Status: DC | PRN
  Administered 2022-02-07: 1000 mL

## 2022-02-07 MED ORDER — CHLORHEXIDINE GLUCONATE CLOTH 2 % EX PADS: 6.0000 | MEDICATED_PAD | Freq: Once | CUTANEOUS | Status: DC

## 2022-02-07 MED ORDER — ORAL CARE MOUTH RINSE: 15.0000 mL | Freq: Once | OROMUCOSAL | Status: DC

## 2022-02-07 MED ORDER — ENSURE PRE-SURGERY PO LIQD: 592.0000 mL | Freq: Once | ORAL | Status: DC

## 2022-02-07 MED ORDER — DEXAMETHASONE SODIUM PHOSPHATE 10 MG/ML IJ SOLN
INTRAMUSCULAR | Status: DC | PRN
Start: 2022-02-07 — End: 2022-02-07
  Administered 2022-02-07: 10 mg via INTRAVENOUS

## 2022-02-07 MED ORDER — CHLORHEXIDINE GLUCONATE 0.12 % MT SOLN: 15.0000 mL | Freq: Once | OROMUCOSAL | Status: DC

## 2022-02-07 MED ORDER — ACETAMINOPHEN 10 MG/ML IV SOLN: 1000.0000 mg | Freq: Once | INTRAVENOUS | Status: DC | PRN

## 2022-02-07 MED ORDER — FENTANYL CITRATE PF 50 MCG/ML IJ SOSY
PREFILLED_SYRINGE | INTRAMUSCULAR | Status: AC
  Filled 2022-02-07: qty 1

## 2022-02-07 MED ORDER — CHLORHEXIDINE GLUCONATE 0.12 % MT SOLN
15.0000 mL | Freq: Once | OROMUCOSAL | Status: DC
Start: 2022-02-07 — End: 2022-02-07

## 2022-02-07 MED ORDER — HEPARIN SODIUM (PORCINE) 5000 UNIT/ML IJ SOLN
5000.0000 [IU] | Freq: Once | INTRAMUSCULAR | Status: AC
  Administered 2022-02-07: 5000 [IU] via SUBCUTANEOUS
  Filled 2022-02-07: qty 1

## 2022-02-07 MED ORDER — METRONIDAZOLE 500 MG PO TABS: 1000.0000 mg | ORAL_TABLET | ORAL | Status: DC

## 2022-02-07 MED ORDER — ROCURONIUM BROMIDE 10 MG/ML (PF) SYRINGE
PREFILLED_SYRINGE | INTRAVENOUS | Status: DC | PRN
  Administered 2022-02-07: 60 mg via INTRAVENOUS

## 2022-02-07 MED ORDER — SODIUM CHLORIDE 0.9 % IV SOLN
2.0000 g | INTRAVENOUS | Status: AC
  Administered 2022-02-07: 2 g via INTRAVENOUS
  Filled 2022-02-07: qty 2

## 2022-02-07 MED ORDER — BISACODYL 5 MG PO TBEC: 20.0000 mg | DELAYED_RELEASE_TABLET | Freq: Once | ORAL | Status: DC

## 2022-02-07 MED ORDER — BUPIVACAINE LIPOSOME 1.3 % IJ SUSP: 20.0000 mL | Freq: Once | INTRAMUSCULAR | Status: DC

## 2022-02-07 MED ORDER — FENTANYL CITRATE PF 50 MCG/ML IJ SOSY
25.0000 ug | PREFILLED_SYRINGE | INTRAMUSCULAR | Status: DC | PRN
  Administered 2022-02-07: 50 ug via INTRAVENOUS

## 2022-02-07 MED ORDER — ENSURE PRE-SURGERY PO LIQD: 296.0000 mL | Freq: Once | ORAL | Status: DC

## 2022-02-07 MED ORDER — ONDANSETRON HCL 4 MG/2ML IJ SOLN
INTRAMUSCULAR | Status: DC | PRN
  Administered 2022-02-07: 4 mg via INTRAVENOUS

## 2022-02-07 MED ORDER — ORAL CARE MOUTH RINSE: 15.0000 mL | Freq: Once | OROMUCOSAL | Status: AC

## 2022-02-07 SURGICAL SUPPLY — 47 items
APPLIER CLIP 5 13 M/L LIGAMAX5 (MISCELLANEOUS)
APPLIER CLIP ROT 10 11.4 M/L (STAPLE)
BAG COUNTER SPONGE SURGICOUNT (BAG) ×2 IMPLANT
BAG RETRIEVAL 10 (BASKET) ×1
CABLE HIGH FREQUENCY MONO STRZ (ELECTRODE) IMPLANT
CHLORAPREP W/TINT 26 (MISCELLANEOUS) ×2 IMPLANT
CLIP APPLIE 5 13 M/L LIGAMAX5 (MISCELLANEOUS) IMPLANT
CLIP APPLIE ROT 10 11.4 M/L (STAPLE) IMPLANT
COVER SURGICAL LIGHT HANDLE (MISCELLANEOUS) ×2 IMPLANT
CUTTER FLEX LINEAR 45M (STAPLE) ×2 IMPLANT
DERMABOND ADVANCED (GAUZE/BANDAGES/DRESSINGS) ×1
DERMABOND ADVANCED .7 DNX12 (GAUZE/BANDAGES/DRESSINGS) ×1 IMPLANT
DRAIN CHANNEL 19F RND (DRAIN) IMPLANT
ELECT PENCIL ROCKER SW 15FT (MISCELLANEOUS) ×2 IMPLANT
ELECT REM PT RETURN 15FT ADLT (MISCELLANEOUS) ×2 IMPLANT
ENDOLOOP SUT PDS II  0 18 (SUTURE)
ENDOLOOP SUT PDS II 0 18 (SUTURE) IMPLANT
EVACUATOR SILICONE 100CC (DRAIN) IMPLANT
GLOVE BIO SURGEON STRL SZ7.5 (GLOVE) ×2 IMPLANT
GLOVE INDICATOR 8.0 STRL GRN (GLOVE) ×2 IMPLANT
GOWN STRL REUS W/ TWL XL LVL3 (GOWN DISPOSABLE) ×2 IMPLANT
GOWN STRL REUS W/TWL XL LVL3 (GOWN DISPOSABLE) ×2
IRRIG SUCT STRYKERFLOW 2 WTIP (MISCELLANEOUS) ×2
IRRIGATION SUCT STRKRFLW 2 WTP (MISCELLANEOUS) ×1 IMPLANT
KIT BASIN OR (CUSTOM PROCEDURE TRAY) ×2 IMPLANT
KIT TURNOVER KIT A (KITS) IMPLANT
PAD POSITIONING PINK XL (MISCELLANEOUS) ×2 IMPLANT
RELOAD 45 VASCULAR/THIN (ENDOMECHANICALS) IMPLANT
RELOAD STAPLE 45 2.5 WHT GRN (ENDOMECHANICALS) IMPLANT
RELOAD STAPLE 45 3.5 BLU ETS (ENDOMECHANICALS) IMPLANT
RELOAD STAPLE TA45 3.5 REG BLU (ENDOMECHANICALS) ×4 IMPLANT
SCISSORS LAP 5X35 DISP (ENDOMECHANICALS) IMPLANT
SET TUBE SMOKE EVAC HIGH FLOW (TUBING) ×2 IMPLANT
SHEARS HARMONIC ACE PLUS 36CM (ENDOMECHANICALS) ×2 IMPLANT
SLEEVE ADV FIXATION 5X100MM (TROCAR) ×2 IMPLANT
SPIKE FLUID TRANSFER (MISCELLANEOUS) ×2 IMPLANT
SUT ETHILON 3 0 PS 1 (SUTURE) IMPLANT
SUT MNCRL AB 4-0 PS2 18 (SUTURE) ×2 IMPLANT
SYS BAG RETRIEVAL 10MM (BASKET) ×1
SYSTEM BAG RETRIEVAL 10MM (BASKET) ×1 IMPLANT
TOWEL OR 17X26 10 PK STRL BLUE (TOWEL DISPOSABLE) IMPLANT
TOWEL OR NON WOVEN STRL DISP B (DISPOSABLE) ×2 IMPLANT
TRAY FOLEY MTR SLVR 14FR STAT (SET/KITS/TRAYS/PACK) ×2 IMPLANT
TRAY FOLEY MTR SLVR 16FR STAT (SET/KITS/TRAYS/PACK) ×2 IMPLANT
TRAY LAPAROSCOPIC (CUSTOM PROCEDURE TRAY) ×2 IMPLANT
TROCAR ADV FIXATION 5X100MM (TROCAR) ×2 IMPLANT
TROCAR XCEL BLUNT TIP 100MML (ENDOMECHANICALS) ×2 IMPLANT

## 2022-02-07 NOTE — Discharge Instructions (Signed)
POST OP INSTRUCTIONS  DIET: As tolerated. Follow a light bland diet the first 24 hours after arrival home, such as soup, liquids, crackers, etc.  Be sure to include lots of fluids daily.  Avoid fast food or heavy meals as your are more likely to get nauseated.  Eat a low fat the next few days after surgery.  Take your usually prescribed home medications unless otherwise directed.  PAIN CONTROL: Pain is best controlled by a usual combination of three different methods TOGETHER: Ice/Heat Over the counter pain medication Prescription pain medication Most patients will experience some swelling and bruising around the surgical site.  Ice packs or heating pads (30-60 minutes up to 6 times a day) will help. Some people prefer to use ice alone, heat alone, alternating between ice & heat.  Experiment to what works for you.  Swelling and bruising can take several weeks to resolve.   It is helpful to take an over-the-counter pain medication regularly for the first few weeks: Ibuprofen (Motrin/Advil) - 200mg tabs - take 3 tabs (600mg) every 6 hours as needed for pain Acetaminophen (Tylenol) - you may take 650mg every 6 hours as needed. You can take this with motrin as they act differently on the body. If you are taking a narcotic pain medication that has acetaminophen in it, do not take over the counter tylenol at the same time.  Iii. NOTE: You may take both of these medications together - most patients  find it most helpful when alternating between the two (i.e. Ibuprofen at 6am,  tylenol at 9am, ibuprofen at 12pm ...) A  prescription for pain medication should be given to you upon discharge.  Take your pain medication as prescribed if your pain is not adequatly controlled with the over-the-counter pain reliefs mentioned above.  Avoid getting constipated.  Between the surgery and the pain medications, it is common to experience some constipation.  Increasing fluid intake and taking a fiber supplement (such as  Metamucil, Citrucel, FiberCon, MiraLax, etc) 1-2 times a day regularly will usually help prevent this problem from occurring.  A mild laxative (prune juice, Milk of Magnesia, MiraLax, etc) should be taken according to package directions if there are no bowel movements after 48 hours.    Dressing: Your incision are covered in Dermabond which is like sterile superglue for the skin. This will come off on it's own in a couple weeks. It is waterproof and you may bathe normally starting the day after your surgery in a shower. Avoid baths/pools/lakes/oceans until your wounds have fully healed.  ACTIVITIES as tolerated:   Avoid heavy lifting (>10lbs or 1 gallon of milk) for the next 6 weeks. You may resume regular (light) daily activities beginning the next day--such as daily self-care, walking, climbing stairs--gradually increasing activities as tolerated.  If you can walk 30 minutes without difficulty, it is safe to try more intense activity such as jogging, treadmill, bicycling, low-impact aerobics.  DO NOT PUSH THROUGH PAIN.  Let pain be your guide: If it hurts to do something, don't do it. You may drive when you are no longer taking prescription pain medication, you can comfortably wear a seatbelt, and you can safely maneuver your car and apply brakes.   FOLLOW UP in our office Please call [facility] at [phone] to set up an appointment to see your surgeon in the office for a follow-up appointment approximately 2 weeks after your surgery. Make sure that you call for this appointment the day you arrive home to   insure a convenient appointment time.  9. If you have disability or family leave forms that need to be completed, you may have them completed by your primary care physician's office; for return to work instructions, please ask our office staff and they will be happy to assist you in obtaining this documentation   When to call us [phone]: Poor pain control Reactions / problems with new  medications (rash/itching, etc)  Fever over 101.5 F (38.5 C) Inability to urinate Nausea/vomiting Worsening swelling or bruising Continued bleeding from incision. Increased pain, redness, or drainage from the incision  The clinic staff is available to answer your questions during regular business hours (8:30am-5pm).  Please don't hesitate to call and ask to speak to one of our nurses for clinical concerns.   A surgeon from [facility] is always on call at the hospitals   If you have a medical emergency, go to the nearest emergency room or call 911.  [facility] [address], [address], [place], [place]  [address] MAIN: [phone] FAX: [phone] [email or web address] 

## 2022-02-07 NOTE — Op Note (Signed)
Molly [name] ?[id number] ? ? ?PRE-OPERATIVE DIAGNOSIS:  Appendiceal polyp ? ?POST-OPERATIVE DIAGNOSIS:  Same ? ?PROCEDURE: Laparoscopic appendectomy ? ?SURGEON:  Sharon Mt. Dema Severin, M.D. ? ?ASSISTANT: OR staff ? ?ANESTHESIA: General endotracheal ? ?EBL:   5 mL ? ?DRAINS: None ? ?SPECIMEN:  Appendix ? ?COUNTS:  Sponge, needle and instrument counts were reported correct x2 at conclusion of the operation ? ?DISPOSITION:  PACU in satisfactory condition ? ?COMPLICATIONS: None ? ?FINDINGS: Filmy type adhesions of small bowel in the right mid upper quadrant.  There were no adhesions in the right lower quadrant.  Relatively normal-appearing appendix.  An appendectomy was carried out taking a cuff of cecum with it to ensure we have a negative distal margin, based upon the endoscopic photos.  Specimen was opened on the back table and there is suggestion of polypoid-like tissue away from our staple line. ? ?DESCRIPTION:  ? ?The patient was identified & brought into the operating room. SCDs were in place and functioning. General endotracheal anesthesia was administered. Preoperative antibiotics were administered. The patient was positioned supine with left arm tucked. Hair on the abdomen was then clipped by the OR team. A foley catheter was inserted under sterile conditions. The abdomen was prepped and draped in the standard sterile fashion. A surgical timeout confirmed our plan. ? ?A small incision was made in the infraumbilical skin. The subcutaneous tissue was dissected and the umbilical stalk identified. The stalk was grasped with a Kocher and retracted outwardly. The infraumbilical fascia was exposed and incised. Peritoneal entry was carefully made bluntly. A 0 Vicryl purse-string suture was placed and then the [facility] port was introduced into the abdomen.  CO2 insufflation commenced to 67mHg. The laparoscope was inserted and confirmed no evidence of trocar site complications. The patient was then positioned in  Trendelenburg. Two additional ports were placed - one in left lower quadrant and another in the suprapubic midline taking care to stay well above the bladder - 3 fingerbreadths above the pubic symphysis. The bed was then slightly tilted to place the left side down. ? ?The appendix was identified and attachments to the appendix to the surrounding tissues were freed without difficulty.  The appendix was elevated.  The mesoappendix was dissected and divided using the harmonic scalpel working towards the base.  We are then able to bluntly develop a window around the base of the appendix that included a small portion of the cecum.  Using a laparoscopic GIA blue load stapler, the appendix and a cuff of cecum were divided using the laparoscopic stapling device.  The staple line is inspected and noted to be intact and hemostatic.  The appendix was then placed into a specimen retrieval bag and removed.  I then went to the back table and opened the specimen in a separate field.  I partially remove the staple line and bivalved a part of the appendix.  There is a suggestion of polypoid-like tissue near the base of the appendix but away from our cecal staple line.  I then scrubbed back in. ? ?The right lower quadrant was conservatively irrigated and hemostasis verified.  Staple line again is intact and healthy in appearance.  The terminal ileum, cecum and ascending colon also appeared normal. The ileocecal valve is widely patent. ? ?The left lower quadrant and suprapubic ports were removed under direct visualization. The EndoBag was then removed through the umbilical port site and passed off as specimen. The CO2 was exhausted from the abdomen. The umbilical fascia was then closed by  closing the 0 Vicryl suture. The fascia was palpated and noted to be completely closed. The skin of all port sites was then approximated using 4-0 Monocryl suture. The incisions were covered with Dermabond. ? ?She was then awakened from general  anesthesia, extubated, and transferred to a stretcher for transport to recover in satisfactory condition.   ?

## 2022-02-07 NOTE — Progress Notes (Signed)
Patient discharged home husband reports will take 30 mins for him to arrive pick patient up went over discharge instructions with him on phone. Patient waiting on ride home is dressed sitting up in chair at bedside with no c/o's. ?

## 2022-02-07 NOTE — Anesthesia Postprocedure Evaluation (Signed)
Anesthesia Post Note ? ?Patient: Molly [name] ? ?Procedure(s) Performed: APPENDECTOMY LAPAROSCOPIC ? ?  ? ?Patient location during evaluation: PACU ?Anesthesia Type: General ?Level of consciousness: awake and alert ?Pain management: pain level controlled ?Vital Signs Assessment: post-procedure vital signs reviewed and stable ?Respiratory status: spontaneous breathing, nonlabored ventilation, respiratory function stable and patient connected to nasal cannula oxygen ?Cardiovascular status: blood pressure returned to baseline and stable ?Postop Assessment: no apparent nausea or vomiting ?Anesthetic complications: no ? ? ?No notable events documented. ? ?Last Vitals:  ?Vitals:  ? 02/07/22 0933 02/07/22 0945  ?BP: 130/69 132/72  ?Pulse: (!) 59 65  ?Resp: 18 14  ?Temp: 36.7 ?C   ?SpO2: 93% 95%  ?  ?Last Pain:  ?Vitals:  ? 02/07/22 0945  ?TempSrc:   ?PainSc: 0-No pain  ? ? ?  ?  ?  ?  ?  ?  ? ?[place] ? ? ? ? ?

## 2022-02-07 NOTE — H&P (Signed)
? ?CC: Here today for surgery ? ?HPI: ?Molly [name] is an 76 y.o. female with history of HLD, collagenous colitis, whom is seen in the office previously for evaluation of appendiceal orifice tubular adenoma. She had a routine colonoscopy completed 11/29/2020 that demonstrated a polyp at the orifice of the appendix that was biopsied and demonstrated a tubular adenoma. This extended into the lumen of the appendix she also had random biopsies obtained that demonstrated findings consistent with collagenous colitis. She was started on isoniazid and noted significant provement in her diarrheal symptoms that have preceded this, she also had a small hyperplastic polyp removed. ? ?She opted to delay evaluation at our office until Monday. She denies any complaints. She reports that her diarrheal symptoms she denies any blood in her stool. She denies any abdominal pain, n/v. ? ?She was recommended and attempted to be scheduled for surgery at our last visit in August, 2022. Her husband had developed atrial fibrillation and she had delayed scheduling any sort of surgery to take care of him. She contacted our office for follow-up and at this juncture would like to go ahead and proceed with surgery. She denies any changes in her health or health history since we met before. She denies any new medications, allergies, or complaints. She denies any abdominal pain, nausea/vomiting, or changes in her bowel habits. She is still smoking and I have again recommended she quit. ? ?Denies any changes in health or health hx since we met in the office. Tolerated bowel prep with satisfactory result. States she is ready for surgery ? ? ?PMH: HLD, collagenous colitis ? ?PSH: Bunnionectomy. She denies any prior abdominal or pelvic surgical history including c-sxs or hysterectomies. ? ?FHx: Denies any known family history of colorectal, breast, endometrial or ovarian cancer ? ?Social Hx: Reports she smokes approximately 5 cigaretters per day.  Denies use of EtOH/illicit drug. 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 ? ?Review of Systems: ?A complete review of systems was obtained from the patient. I have reviewed this information and discussed as appropriate with the patient. See HPI as well for other ROS.  ? ?Past Medical History:  ?Diagnosis Date  ? Anxiety   ? Arthritis   ? mild back and neck  ? Cataract   ? removed bilatarly  ? Headache   ? High cholesterol   ? 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 Upper respiratory infection   ? 2023  ? ? ?Past Surgical History:  ?Procedure Laterality Date  ? CATARACT EXTRACTION Bilateral   ? COLONOSCOPY WITH PROPOFOL N/A 06/26/2015  ? Procedure: COLONOSCOPY WITH PROPOFOL;  Surgeon: Lollie Sails, MD;  Location: [facility] ENDOSCOPY;  Service: Endoscopy;  Laterality: N/A;  ? COLONOSCOPY WITH PROPOFOL N/A 06/27/2015  ? Procedure: COLONOSCOPY WITH PROPOFOL;  Surgeon: Lollie Sails, MD;  Location: [facility] ENDOSCOPY;  Service: Endoscopy;  Laterality: N/A;  ? dental implants    ? REFRACTIVE SURGERY    ? TONSILLECTOMY    ? ? ?Family History  ?Problem Relation Age of Onset  ? Colon cancer Neg Hx   ? Esophageal cancer Neg Hx   ? Rectal cancer Neg Hx   ? Stomach cancer Neg Hx   ? ? ?Social:  reports that she has been smoking cigarettes. She has been smoking an average of .5 packs per day. She has never used smokeless tobacco. She reports current alcohol use of about 1.0 standard  drink per week. She reports that she does not use drugs. ? ?Allergies: No Known Allergies ? ?Medications: I have reviewed the patient's current medications. ? ?No results found for this or any previous visit (from the past 48 hour(s)). ? ?No results found. ? ?ROS - all of the below systems have been reviewed with the patient and positives are indicated with bold text ?General: chills, fever or night sweats ?Eyes: blurry vision or double vision ?ENT: epistaxis or sore  throat ?Allergy/Immunology: itchy/watery eyes or nasal congestion ?Hematologic/Lymphatic: bleeding problems, blood clots or swollen lymph nodes ?Endocrine: temperature intolerance or unexpected weight changes ?Breast: new or changing breast lumps or nipple discharge ?Resp: cough, shortness of breath, or wheezing ?CV: chest pain or dyspnea on exertion ?GI: as per HPI ?GU: dysuria, trouble voiding, or hematuria ?MSK: joint pain or joint stiffness ?Neuro: TIA or stroke symptoms ?Derm: pruritus and skin lesion changes ?Psych: anxiety and depression ? ?PE ?Blood pressure 126/72, pulse (!) 56, temperature 97.9 ?F (36.6 ?C), temperature source Oral, resp. rate 16, height 5' 6"  (1.676 m), weight 65.8 kg, SpO2 100 %. ?Constitutional: NAD; conversant ?Eyes: Moist conjunctiva; no lid lag; anicteric; PERRL ?Lungs: Normal respiratory effort ?CV: RRR ?GI: Abd soft, NT/ND ?MSK: Normal range of motion of extremities ?Psychiatric: Appropriate affect; alert and oriented x3 ? ?No results found for this or any previous visit (from the past 48 hour(s)). ? ?No results found. ? ?A/P: ?Molly [name] is an 76 y.o. female with hx of HLD, collagenous colitis here for evaluation of endoscopically unresectable appendiceal orifice polyp - biopsy showed tubular adenoma ? ?-Still smoking, understands risks ?-The anatomy and physiology of the GI tract was reviewed with the patient again today. The pathophysiology of colon polyps was discussed as well with associated pictures. Over the course of years, polyps certainly being known to transform to colon cancer. ?-We have discussed various different treatment options going forward including surgery (the most definitive) to address this -laparoscopic appendectomy, possibility of ileocecectomy if necessary for margin purposes. We discussed that with time polyps can grow and with delaying intervention for this, would increase her potential need for an ileocecectomy/right hemicolectomy. We discussed  this would largely be based upon intraoperative findings. ?-The planned procedures, material risks (including, but not limited to, pain, bleeding, infection, scarring, need for blood transfusion, damage to surrounding structures- blood vessels/nerves/viscus/organs, leak from anastomosis or staple line, need for additional procedures, scenarios where a stoma may be necessary, worsening of pre-existing medical conditions, hernia, recurrence, pneumonia, heart attack, stroke, death) benefits and alternatives to surgery were discussed at length. The patient's questions were answered to her satisfaction, she voiced understanding and elected to proceed with surgery. Additionally, we discussed typical postoperative expectations and the recovery process. ? ?Nadeen Landau, MD ?[facility], [facility] ?

## 2022-02-07 NOTE — Anesthesia Postprocedure Evaluation (Signed)
Anesthesia Post Note ? ?Patient: Molly [name] ? ?Procedure(s) Performed: APPENDECTOMY LAPAROSCOPIC ? ?  ? ?Patient location during evaluation: PACU ?Anesthesia Type: General ?Level of consciousness: awake and alert ?Pain management: pain level controlled ?Vital Signs Assessment: post-procedure vital signs reviewed and stable ?Respiratory status: spontaneous breathing, nonlabored ventilation, respiratory function stable and patient connected to nasal cannula oxygen ?Cardiovascular status: blood pressure returned to baseline and stable ?Postop Assessment: no apparent nausea or vomiting ?Anesthetic complications: no ? ? ?No notable events documented. ? ?Last Vitals:  ?Vitals:  ? 02/07/22 0915 02/07/22 0930  ?BP: 124/70 130/69  ?Pulse: 64 61  ?Resp: 18 12  ?Temp:  36.7 ?C  ?SpO2: 96% 92%  ?  ?Last Pain:  ?Vitals:  ? 02/07/22 0930  ?TempSrc:   ?PainSc: 0-No pain  ? ? ?  ?  ?  ?  ?  ?  ? ?Barnet Glasgow ? ? ? ? ?

## 2022-02-07 NOTE — Anesthesia Procedure Notes (Signed)
Procedure Name: Intubation ?Date/Time: 02/07/2022 7:55 AM ?Performed by: Gean Maidens, CRNA ?Pre-anesthesia Checklist: Emergency Drugs available, Suction available, Patient identified, Patient being monitored and Timeout performed ?Patient Re-evaluated:Patient Re-evaluated prior to induction ?Oxygen Delivery Method: Circle system utilized ?Preoxygenation: Pre-oxygenation with 100% oxygen ?Induction Type: IV induction ?Ventilation: Mask ventilation without difficulty ?Laryngoscope Size: Mac and 3 ?Grade View: Grade I ?Tube type: Oral ?Tube size: 7.0 mm ?Number of attempts: 1 ?Airway Equipment and Method: Stylet ?Placement Confirmation: positive ETCO2, ETT inserted through vocal cords under direct vision and breath sounds checked- equal and bilateral ?Secured at: 21 cm ?Tube secured with: Tape ?Dental Injury: Teeth and Oropharynx as per pre-operative assessment  ? ? ? ? ?

## 2022-02-07 NOTE — Transfer of Care (Signed)
Immediate Anesthesia Transfer of Care Note ? ?Patient: Molly [name] ? ?Procedure(s) Performed: APPENDECTOMY LAPAROSCOPIC ? ?Patient Location: PACU ? ?Anesthesia Type:General ? ?Level of Consciousness: awake, alert  and oriented ? ?Airway & Oxygen Therapy: Patient Spontanous Breathing and Patient connected to face mask oxygen ? ?Post-op Assessment: Report given to RN and Post -op Vital signs reviewed and stable ? ?Post vital signs: Reviewed and stable ? ?Last Vitals:  ?Vitals Value Taken Time  ?BP 130/72 02/07/22 0851  ?Temp    ?Pulse 71 02/07/22 0853  ?Resp 15 02/07/22 0853  ?SpO2 98 % 02/07/22 0853  ?Vitals shown include unvalidated device data. ? ?Last Pain:  ?Vitals:  ? 02/07/22 0613  ?TempSrc:   ?PainSc: 0-No pain  ?   ? ?  ? ?Complications: No notable events documented. ?

## 2022-02-08 ENCOUNTER — Encounter (HOSPITAL_COMMUNITY): Payer: Self-pay | Admitting: Surgery

## 2022-02-08 LAB — SURGICAL PATHOLOGY

## 2022-02-15 ENCOUNTER — Other Ambulatory Visit: Payer: Self-pay

## 2022-02-15 ENCOUNTER — Telehealth: Payer: Self-pay | Admitting: Gastroenterology

## 2022-02-15 DIAGNOSIS — K52831 Collagenous colitis: Secondary | ICD-10-CM

## 2022-02-15 MED ORDER — BUDESONIDE 3 MG PO CPEP: 3.0000 mg | ORAL_CAPSULE | Freq: Every day | ORAL | 11 refills | Status: DC

## 2022-02-15 NOTE — Telephone Encounter (Signed)
Patient has been trying to get her budesonide, but it requires a prior authorization.  Please call patient and advise.  Thank you.

## 2022-02-16 ENCOUNTER — Other Ambulatory Visit (HOSPITAL_COMMUNITY): Payer: Self-pay

## 2022-02-16 ENCOUNTER — Telehealth: Payer: Self-pay | Admitting: Pharmacy Technician

## 2022-02-16 NOTE — Telephone Encounter (Signed)
Patient Advocate Encounter  Received notification from [place] that prior authorization for BUDESONIDE '3MG'$  is required.   PA submitted on 5.20.23 Key [id number] Status is pending   [facility] will continue to follow  Luciano Cutter, CPhT Patient Advocate Phone: [phone]

## 2022-02-16 NOTE — Telephone Encounter (Signed)
PA has been submitted.

## 2022-02-18 ENCOUNTER — Telehealth: Payer: Self-pay | Admitting: Gastroenterology

## 2022-02-18 NOTE — Telephone Encounter (Signed)
Received a call from [facility].  Patient's budesonide has been approved for a year with a 02/16/22 start date.  She will be faxing the authorization.

## 2022-02-19 ENCOUNTER — Other Ambulatory Visit (HOSPITAL_COMMUNITY): Payer: Self-pay

## 2022-02-19 NOTE — Telephone Encounter (Signed)
Received notification from [place] [facility]  regarding a prior authorization for BUDESONIDE '3MG'$ . Authorization has been APPROVED from 5.20.23 to 5.20.24.

## 2022-02-22 ENCOUNTER — Encounter: Payer: Self-pay | Admitting: Gastroenterology

## 2022-02-22 ENCOUNTER — Ambulatory Visit: Payer: Medicare Other | Admitting: Gastroenterology

## 2022-02-22 ENCOUNTER — Other Ambulatory Visit (HOSPITAL_COMMUNITY): Payer: Self-pay

## 2022-02-22 VITALS — BP 120/70 | HR 64 | Ht 67.0 in | Wt 147.0 lb

## 2022-02-22 DIAGNOSIS — K52831 Collagenous colitis: Secondary | ICD-10-CM

## 2022-02-22 NOTE — Progress Notes (Signed)
Referring Provider: [name], MD Primary Care Physician:  [name], MD  Chief complaint:  Collagenous colitis   IMPRESSION:  Collagenous colitis     - Developed altered bowel habits following her colonoscopy in 2016    - TTGA and IgA negative    - Collagenous colitis on colon biopsies 11/29/2020    - treated with Entocort starting in March 2022 with improvement in symptoms History of colon polyps on each colonoscopy   - appendiceal orifice tubular adenoma on colonoscopy 11/29/2020    - surgery with Dr. Dema Severin 02/07/22 showed polypoid tissue but no residual polyp    - surveillance colonoscopy recommended 2024 Laparoscopic appendectomy with Dr. 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     HPI: Molly [name] is a 76 y.o. female who returns in follow-up.  She was last seen 06/12/2021 for collagenous colitis.  The interval history is obtained through the patient, review of her electronic health record, and my recent conversation with Dr. Dema Severin.   She has a history of non-bloody, predominantly post-prandial diarrhea with associated urgency that developed immediately after her colonoscopy in 2016. Thyroid, infectious stools studies, and celiac labs were negative.   Colonoscopy 11/29/20 for a positive Cologuard  revealed a tubular adenoma at the IC valve and random colon biopsies showed collagenous colitis.  A hyperplastic polyp was removed from the sigmoid colon.  She returns today after having laparoscopic appendectomy taking a cuff of associated cecum with Dr. Dema Severin for a small tubular adenoma at the IC valve.  Pathology showed polypoid tissue but no remaining adenoma. She is doing well postoperatively. Dr. Dema Severin and I discussed surveillance colonoscopy in one year.   She has done well on budesonide having 1-2 soft, formed bowel movements daily. Her symptoms remain controlled on 3 mg daily.   GI ROS is otherwise negative.   She is resistant to any further colonoscopy.    Endoscopic history: - Has had 3 colonoscopies. The first two with Dr. Denice Paradise and small polyps were removed.  She does not believe these were precancerous polyps.  The procedures were performed without incident.   - Surveillance colonoscopy with Dr. Gustavo Lah was attempted 06/26/2015 but could not be performed due to poor prep.  Was successfully completed 06/27/2015 for personal history of colon polyps revealed 2 small rectosigmoid hyperplastic polyps, pancolonic diverticulosis, and a redundant sigmoid colon.  - Colonoscopy 11/29/20 revealed a tubular adenoma at the IC valve and collagenous colitis.  A hyperplastic polyp was removed from the sigmoid colon. 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    Upper respiratory infection    2023    Past Surgical History:  Procedure  Laterality Date   CATARACT EXTRACTION Bilateral    COLONOSCOPY WITH PROPOFOL N/A 06/26/2015   Procedure: COLONOSCOPY WITH PROPOFOL;  Surgeon: Lollie Sails, MD;  Location[facility] ENDOSCOPY;  Service: Endoscopy;  Laterality: N/A;   COLONOSCOPY WITH PROPOFOL N/A 06/27/2015   Procedure: COLONOSCOPY WITH  PROPOFOL;  Surgeon: Lollie Sails, MD;  Location: [facility] ENDOSCOPY;  Service: Endoscopy;  Laterality: N/A;   dental implants     LAPAROSCOPIC APPENDECTOMY N/A 02/07/2022   Procedure: APPENDECTOMY LAPAROSCOPIC;  Surgeon: Ileana Roup, MD;  Location: [facility] ORS;  Service: General;  Laterality: N/A;   REFRACTIVE SURGERY     TONSILLECTOMY      Current Outpatient Medications  Medication Sig Dispense Refill   Ascorbic Acid 500 MG CHEW Chew 500 mg by mouth daily.     atorvastatin (LIPITOR) 20 MG tablet Take 20 mg by mouth daily.     budesonide (ENTOCORT EC) 3 MG 24 hr capsule Take 1 capsule (3 mg total) by mouth daily. 30 capsule 11   Cholecalciferol 2000 UNITS CAPS Take 4,000 Units by mouth every morning.     citalopram (CELEXA) 10 MG tablet Take 10 mg by mouth daily.     Multiple Vitamins-Minerals (CENTRUM SILVER PO) Take 1 tablet by mouth daily.     Probiotic Product (PROBIOTIC PO) Take 1 tablet by mouth daily. Physician's Choice     No current facility-administered medications for this visit.    Allergies as of 02/22/2022   (No Known Allergies)    Family History  Problem Relation Age of Onset   Colon cancer Neg Hx    Esophageal cancer Neg Hx    Rectal cancer Neg Hx    Stomach cancer Neg Hx       Physical Exam: General:   Alert,  well-nourished, pleasant and cooperative in NAD Head:  Normocephalic and atraumatic. Eyes:  Sclera clear, no icterus.   Conjunctiva pink. Abdomen:  Soft, nontender, nondistended, normal bowel sounds, no rebound or guarding. No hepatosplenomegaly.  Well healed lap scars.  Neurologic:  Alert and  oriented x4;  grossly nonfocal Skin:  Intact without significant lesions or rashes. Psych:  Alert and cooperative. Normal mood and affect.  I spent over 30 minutes, including independent review of results, communicating results with the patient directly, face-to-face time with the patient, coordinating care, ordering studies and medications as appropriate,  and documentation.     Cole Klugh L. Tarri Glenn, MD, MPH 02/22/2022, 1:33 PM

## 2022-02-22 NOTE — Patient Instructions (Signed)
It was a pleasure to see you today.  Please continue to take your budesonide at the lowest dose needed to control your diarrhea.  We should consider a colonoscopy in one year to be sure that the small polyp at your appendiceal orifice was completely removed and that you don't grow any additional polyps.

## 2022-03-19 NOTE — Progress Notes (Unsigned)
  [place] Phone: [phone] Subjective:   [name], am serving as a scribe for Dr. Hulan Saas.  I'm seeing this patient by the request  of:  [name], MD  CC: Low back pain follow-up  GUR:[id number]  Molly [name] is a 76 y.o. female coming in with complaint of back and neck pain. OMT on 01/18/2022. Patient states that she still has soreness with yardwork in L side of lumbar spine. Also has L trap tightness as well due to carrying stress in this area.  0.  Medications patient has been prescribed: None  Taking:         Reviewed prior external information including notes and imaging from previsou exam, outside providers and external EMR if available.   As well as notes that were available from care everywhere and other healthcare systems.  Past medical history, social, surgical and family history all reviewed in electronic medical record.  No pertanent information unless stated regarding to the chief complaint.   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   General: No apparent distress alert and oriented x3 mood and affect normal, dressed appropriately.  HEENT: Pupils equal, extraocular movements intact  Respiratory: Patient's speak in full sentences and does not appear short of breath  Cardiovascular: No lower extremity edema, non tender, no erythema  Gait MSK:  Back tender to palpation mostly on the left sacroiliac joint.  Patient still has a positive FABER test noted.  Still limited range of motion of the hip noted.  Osteopathic  findings  T3 extended rotated and side bent left with inhaled rib T9 extended rotated and side bent left L2 flexed rotated and side bent right Sacrum right on right       Assessment and Plan:  Low back pain Patient does have low back pain as well as some arthritic changes of the left hip.  We discussed with patient about icing regimen and home exercises.  Which activities to do which ones to avoid.  We discussed FABER testing.  Follow-up again in 6 to 8 weeks otherwise.    Nonallopathic problems  Decision today to treat with OMT was based on Physical Exam  After verbal consent patient was treated with HVLA, ME, FPR techniques in cervical, rib, thoracic, lumbar, and sacral  areas  Patient tolerated the procedure well with improvement in symptoms  Patient given exercises, stretches and lifestyle modifications  See medications in patient instructions if given  Patient will follow up in 4-8 weeks    The above documentation has been reviewed and is accurate and complete [name], DO          Note: This dictation was prepared with Dragon dictation along with smaller phrase technology. Any transcriptional errors that result from this process are unintentional.

## 2022-03-20 ENCOUNTER — Ambulatory Visit: Payer: Medicare Other | Admitting: Family Medicine

## 2022-03-20 VITALS — BP 122/80 | HR 79 | Ht 67.0 in | Wt 148.0 lb

## 2022-03-20 DIAGNOSIS — M9904 Segmental and somatic dysfunction of sacral region: Secondary | ICD-10-CM | POA: Diagnosis not present

## 2022-03-20 DIAGNOSIS — M9903 Segmental and somatic dysfunction of lumbar region: Secondary | ICD-10-CM | POA: Diagnosis not present

## 2022-03-20 DIAGNOSIS — M9902 Segmental and somatic dysfunction of thoracic region: Secondary | ICD-10-CM | POA: Diagnosis not present

## 2022-03-20 DIAGNOSIS — M545 Low back pain, unspecified: Secondary | ICD-10-CM | POA: Diagnosis not present

## 2022-03-20 NOTE — Patient Instructions (Signed)
Good to see you Keep it up and work on low back See me in 6-8 weeks

## 2022-03-20 NOTE — Assessment & Plan Note (Signed)
Patient does have low back pain as well as some arthritic changes of the left hip.  We discussed with patient about icing regimen and home exercises.  Which activities to do which ones to avoid.  We discussed FABER testing.  Follow-up again in 6 to 8 weeks otherwise.

## 2022-05-02 NOTE — Progress Notes (Unsigned)
[place] [address] [place] Phone: [phone] Subjective:   Molly [name], am serving as a scribe for Dr. Hulan Saas.  I'm seeing this patient by the request  of:  [name], MD  CC: Neck and back pain follow-up  POE:[id number]  Molly [name] is a 76 y.o. female coming in with complaint of back and neck pain. OMT on 03/20/2022. Patient states that she has not been doing her exercises as much but does know that they are helpful when she does them. Got new shoes from the [employer] and she feels better.   Medications patient has been prescribed: None  Taking:         Reviewed prior external information including notes and imaging from previsou exam, outside providers and external EMR if available.   As well as notes that were available from care everywhere and other healthcare systems.  Past medical history, social, surgical and family history all reviewed in electronic medical record.  No pertanent information unless stated regarding to the chief complaint.   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 POSITIVE muscle aches  Objective  Blood pressure 132/82, pulse 63, height '5\' 7"'$  (1.702 m), weight 150 lb (68 kg), SpO2 91 %.   General: No apparent distress alert and oriented x3 mood and affect normal, dressed appropriately.  HEENT: Pupils equal, extraocular movements intact  Respiratory: Patient's speak in  full sentences and does not appear short of breath  Cardiovascular: No lower extremity edema, non tender, no erythema  Gait MSK:  Back does have some loss of lordosis.  Tightness mostly around the left sacroiliac joint.  Mild positive Corky Sox.  Worsening pain with extension.  Osteopathic findings  C7 flexed rotated and side bent left T3 extended rotated and side bent right inhaled rib T9 extended rotated and side bent left L3 flexed rotated and side bent right Sacrum left on left        Assessment and Plan:  Low back pain Low back pain, neck pain.  Discussed with patient again home exercises.  Patient has had some more stress and recently did have a loss of a family member.  Has not been doing the exercises regularly.  Discussed icing regimen and home exercises.  Follow-up again in 4 to 6 weeks    Nonallopathic problems  Decision today to treat with OMT was based on Physical Exam  After verbal consent patient was treated with HVLA, ME, FPR techniques in cervical, rib, thoracic, lumbar, and sacral  areas  Patient tolerated the procedure well with improvement in symptoms  Patient given exercises, stretches and lifestyle modifications  See medications in patient instructions if given  Patient will follow up in 4-8 weeks     The above documentation has been reviewed and is accurate and complete [name], DO         Note: This dictation was  prepared with Dragon dictation along with smaller phrase technology. Any transcriptional errors that result from this process are unintentional.

## 2022-05-07 ENCOUNTER — Encounter: Payer: Self-pay | Admitting: Family Medicine

## 2022-05-07 ENCOUNTER — Ambulatory Visit: Payer: Medicare Other | Admitting: Family Medicine

## 2022-05-07 VITALS — BP 132/82 | HR 63 | Ht 67.0 in | Wt 150.0 lb

## 2022-05-07 DIAGNOSIS — M545 Low back pain, unspecified: Secondary | ICD-10-CM

## 2022-05-07 DIAGNOSIS — M9902 Segmental and somatic dysfunction of thoracic region: Secondary | ICD-10-CM

## 2022-05-07 DIAGNOSIS — M9901 Segmental and somatic dysfunction of cervical region: Secondary | ICD-10-CM

## 2022-05-07 DIAGNOSIS — M9908 Segmental and somatic dysfunction of rib cage: Secondary | ICD-10-CM

## 2022-05-07 DIAGNOSIS — M9904 Segmental and somatic dysfunction of sacral region: Secondary | ICD-10-CM | POA: Diagnosis not present

## 2022-05-07 DIAGNOSIS — M9903 Segmental and somatic dysfunction of lumbar region: Secondary | ICD-10-CM

## 2022-05-07 NOTE — Patient Instructions (Signed)
Good to see you! Good luck with Addison and the dog

## 2022-05-08 NOTE — Assessment & Plan Note (Signed)
Low back pain, neck pain.  Discussed with patient again home exercises.  Patient has had some more stress and recently did have a loss of a family member.  Has not been doing the exercises regularly.  Discussed icing regimen and home exercises.  Follow-up again in 4 to 6 weeks

## 2022-06-13 NOTE — Progress Notes (Unsigned)
  [place] Phone: [phone] Subjective:   [name], am serving as a scribe for Dr. Hulan Saas.   I'm seeing this patient by the request  of:  [name], MD  CC: back and neck pain follow up   [id number]  Molly [name] is a 76 y.o. female coming in with complaint of back and neck pain. OMT on 05/07/2022. Patient states that her lower back pain has improved. Believes L hamstring and neck are tight from pulling annuals.   Medications patient has been prescribed: None  Taking:         Reviewed prior external information including notes and imaging from previsou exam, outside providers and external EMR if available.   As well as notes that were available from care everywhere and other healthcare systems.  Past medical history, social, surgical and family history all reviewed in electronic medical record.  No pertanent information unless stated regarding to the chief complaint.   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 POSITIVE muscle aches  Objective  Blood pressure 124/86, pulse 73, height '5\' 7"'$  (1.702 m), SpO2 96 %.   General: No apparent distress alert and oriented x3 mood and affect normal, dressed appropriately.  HEENT: Pupils equal, extraocular movements intact  Respiratory: Patient's speak in full sentences and does not appear short of breath  Cardiovascular: No lower extremity edema, non  tender, no erythema  Gait antalgic  MSK:  Back low back exam does have some loss of lordosis.  Discussed icing regimen and home exercises.  Increase activity slowly. SLT FABER  Osteopathic findings  T5 extended rotated and side bent left L2 flexed rotated and side bent right Sacrum right on right     Assessment and Plan:  Low back pain Low back does have loss of lordosis  continue HEP, core strengthening Patient does have some arthritic changes of other things.  No other significant changes at this time.  We will follow-up again in 6 to 8 weeks.    Nonallopathic problems  Decision today to treat with OMT was based on Physical Exam  After verbal consent patient was treated with HVLA, ME, FPR techniques in  thoracic, lumbar, and sacral  areas  Patient tolerated the procedure well with improvement in symptoms  Patient given exercises, stretches and lifestyle modifications  See medications in patient instructions if given  Patient will follow up in 4-8 weeks     The above documentation has been reviewed and is accurate and complete [name], DO         Note: This dictation was prepared with Dragon dictation along with smaller phrase technology. Any transcriptional errors that result from this process are unintentional.

## 2022-06-18 ENCOUNTER — Ambulatory Visit: Payer: Medicare Other | Admitting: Family Medicine

## 2022-06-18 VITALS — BP 124/86 | HR 73 | Ht 67.0 in

## 2022-06-18 DIAGNOSIS — M9903 Segmental and somatic dysfunction of lumbar region: Secondary | ICD-10-CM | POA: Diagnosis not present

## 2022-06-18 DIAGNOSIS — M9902 Segmental and somatic dysfunction of thoracic region: Secondary | ICD-10-CM | POA: Diagnosis not present

## 2022-06-18 DIAGNOSIS — M545 Low back pain, unspecified: Secondary | ICD-10-CM | POA: Diagnosis not present

## 2022-06-18 DIAGNOSIS — M9904 Segmental and somatic dysfunction of sacral region: Secondary | ICD-10-CM | POA: Diagnosis not present

## 2022-06-18 NOTE — Patient Instructions (Signed)
Thigh compression  Heel lifts Exercises See me in 6-8 weeks

## 2022-06-18 NOTE — Assessment & Plan Note (Signed)
Low back does have loss of lordosis  continue HEP, core strengthening Patient does have some arthritic changes of other things.  No other significant changes at this time.  We will follow-up again in 6 to 8 weeks.

## 2022-07-31 NOTE — Progress Notes (Unsigned)
[place] Phone: [phone] Subjective:   Molly [name], am serving as a scribe for Dr. Hulan Saas.  I'm seeing this patient by the request  of:  [name], MD  CC: Back and neck pain follow-up  [id number]  Molly [name] is a 76 y.o. female coming in with complaint of back and neck pain. OMT 06/18/2022. Patient states that L side of neck has been tight. Had massage one week ago. Right side of lower back is also painful due to housework.   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   As well as notes that were available from care everywhere and other healthcare systems.  This includes patient's most recent follow-up with primary care for patient to get a refill of her Celexa but otherwise work-up was fairly unremarkable.  Past medical history, social, surgical and family history all reviewed in electronic medical record.  [name] pertanent information unless stated regarding to the chief complaint.   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  POSITIVE muscle aches  Objective  There were [name] vitals taken for this visit.   General: [name] apparent distress alert and oriented x3 mood and affect normal, dressed appropriately.  HEENT: Pupils equal, extraocular movements intact  Respiratory: Patient's speak in full sentences and does not appear short of breath  Cardiovascular: [name] lower extremity edema, non tender, [name] erythema  Gait very minorly antalgic MSK:  Back back does have some mild loss of lordosis.  Some loss of sidebending and flexion with greatest last level of the extension of the back.  Negative straight leg test.  Osteopathic findings  T9 extended rotated and side bent left L2 flexed rotated and side bent right Sacrum right on right       Assessment and Plan:  [name] problem-specific Assessment & Plan notes found for this encounter.    Nonallopathic problems  Decision today to treat with OMT was based on Physical Exam  After verbal consent patient was treated with HVLA, ME, FPR techniques in  thoracic, lumbar, and sacral  areas  Patient tolerated the procedure well with improvement in symptoms  Patient given exercises, stretches and lifestyle modifications  See medications in patient instructions if given  Patient will follow up in 4-8 weeks     The above documentation has been reviewed and is accurate and complete [name], DO         Note: This dictation was prepared with Dragon dictation along with smaller phrase technology. Any transcriptional  errors that result from this process are unintentional.

## 2022-08-05 ENCOUNTER — Ambulatory Visit: Payer: Medicare Other | Admitting: Family Medicine

## 2022-08-05 VITALS — BP 120/72 | HR 72 | Ht 67.0 in | Wt 148.0 lb

## 2022-08-05 DIAGNOSIS — M9903 Segmental and somatic dysfunction of lumbar region: Secondary | ICD-10-CM | POA: Diagnosis not present

## 2022-08-05 DIAGNOSIS — M9904 Segmental and somatic dysfunction of sacral region: Secondary | ICD-10-CM | POA: Diagnosis not present

## 2022-08-05 DIAGNOSIS — M9902 Segmental and somatic dysfunction of thoracic region: Secondary | ICD-10-CM | POA: Diagnosis not present

## 2022-08-05 DIAGNOSIS — M545 Low back pain, unspecified: Secondary | ICD-10-CM | POA: Diagnosis not present

## 2022-08-05 NOTE — Patient Instructions (Signed)
Good to see you  Follow up with hubby

## 2022-08-06 NOTE — Assessment & Plan Note (Signed)
Chronic overall.  Did have some increasing neck discomfort as well but we will focus still on the back more than anything else.  We discussed continuing to work on the posterior aspect.  Do believe that patient could continue to increase activity and we will follow-up again in 8 weeks.

## 2022-09-25 ENCOUNTER — Ambulatory Visit: Payer: Medicare Other | Admitting: Family Medicine

## 2022-10-08 NOTE — Progress Notes (Unsigned)
[place] Phone: [phone] Subjective:   Molly [name], am serving as a scribe for Dr. Hulan Saas.  I'm seeing this patient by the request  of:  [name], MD  CC: back and neck pain follow up   [id number]  Molly [name] is a 77 y.o. female coming in with complaint of back and neck pain. OMT 08/05/2022. Patient states that she continues to have neck pain. [name] different than last visit.  Patient still states that nothing that is stopping her from activity.  Describes the pain as a dull, throbbing aching pain from time to time.  Medications patient has been prescribed: None  Taking:         Reviewed prior external information including notes and imaging from previsou exam, outside providers and external EMR if available.   As well as notes that were available from care everywhere and other healthcare systems.  Past medical history, social, surgical and family history all reviewed in electronic medical record.  [name] pertanent information unless stated regarding to the chief complaint.   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 POSITIVE muscle aches  Objective  Blood pressure 108/78, pulse (!) 58, height '5\' 7"'$  (1.702 m), weight 145 lb (65.8 kg), SpO2 98 %.   General: [name] apparent distress alert and oriented x3 mood and affect normal, dressed appropriately.  HEENT: Pupils equal, extraocular  movements intact  Respiratory: Patient's speak in full sentences and does not appear short of breath  Cardiovascular: [name] lower extremity edema, non tender, [name] erythema  Gait MSK:  Back low back exam does have some loss of lordosis.  The patient does have some tenderness to palpation.  Does have some limited sidebending of the neck noted and some limited left-sided rotation.  Osteopathic findings  C2 flexed rotated and side bent left C6 flexed rotated and side bent left T3 extended rotated and side bent left inhaled rib T9 extended rotated and side bent left L2 flexed rotated and side bent right Sacrum right on right       Assessment and Plan:  Low back pain Low back exam does have some loss of lordosis.  Some tenderness to palpation in the paraspinal musculature.  Patient does still have tightness noted but nothing severe.  Having more limitation of the neck anything else at the moment.  Discussed home exercises and icing regimen.  Follow-up again in 6 to 8 weeks    Nonallopathic problems  Decision today to treat with OMT was based on Physical Exam  After verbal consent patient was treated with HVLA, ME, FPR techniques in cervical, rib, thoracic, lumbar, and sacral  areas  Patient tolerated the procedure well with improvement in symptoms  Patient given exercises, stretches and lifestyle modifications  See medications in patient instructions if given  Patient will follow up in 4-8 weeks    The above  documentation has been reviewed and is accurate and complete [name], DO          Note: This dictation was prepared with Dragon dictation along with smaller phrase technology. Any transcriptional errors that result from this process are unintentional.

## 2022-10-14 ENCOUNTER — Ambulatory Visit: Payer: Medicare Other | Admitting: Family Medicine

## 2022-10-14 ENCOUNTER — Encounter: Payer: Self-pay | Admitting: Family Medicine

## 2022-10-14 VITALS — BP 108/78 | HR 58 | Ht 67.0 in | Wt 145.0 lb

## 2022-10-14 DIAGNOSIS — M9902 Segmental and somatic dysfunction of thoracic region: Secondary | ICD-10-CM | POA: Diagnosis not present

## 2022-10-14 DIAGNOSIS — M545 Low back pain, unspecified: Secondary | ICD-10-CM | POA: Diagnosis not present

## 2022-10-14 DIAGNOSIS — M9903 Segmental and somatic dysfunction of lumbar region: Secondary | ICD-10-CM

## 2022-10-14 DIAGNOSIS — M9908 Segmental and somatic dysfunction of rib cage: Secondary | ICD-10-CM

## 2022-10-14 DIAGNOSIS — M9901 Segmental and somatic dysfunction of cervical region: Secondary | ICD-10-CM | POA: Diagnosis not present

## 2022-10-14 DIAGNOSIS — M9904 Segmental and somatic dysfunction of sacral region: Secondary | ICD-10-CM | POA: Diagnosis not present

## 2022-10-14 NOTE — Assessment & Plan Note (Signed)
Low back exam does have some loss of lordosis.  Some tenderness to palpation in the paraspinal musculature.  Patient does still have tightness noted but nothing severe.  Having more limitation of the neck anything else at the moment.  Discussed home exercises and icing regimen.  Follow-up again in 6 to 8 weeks

## 2022-10-14 NOTE — Patient Instructions (Signed)
See me in 2 months 

## 2022-12-04 ENCOUNTER — Encounter: Payer: Self-pay | Admitting: Gastroenterology

## 2022-12-06 NOTE — Progress Notes (Unsigned)
[place] Phone: [phone] Subjective:   [name], am serving as a scribe for Dr. Hulan Saas.  I'm seeing this patient by the request  of:  [name], MD  CC: back and neck pain follow up   [id number]  Molly [name] is a 77 y.o. female coming in with complaint of back and neck pain. OMT 10/14/2022. Patient states that she is having L shoulder and neck tightness since last week when doing spring cleaning.   Medications patient has been prescribed: None  Taking:         Reviewed prior external information including notes and imaging from previsou exam, outside providers and external EMR if available.   As well as notes that were available from care everywhere and other healthcare systems.  Past medical history, social, surgical and family history all reviewed in electronic medical record.  No pertanent information unless stated regarding to the chief complaint.   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 POSITIVE muscle aches  Objective  Blood pressure 112/80, pulse 62, height '5\' 7"'$  (1.702 m), weight 147 lb (66.7 kg), SpO2 96 %.   General: No apparent distress alert and oriented x3 mood and affect normal, dressed appropriately.  HEENT: Pupils equal, extraocular movements intact  Respiratory: Patient's speak in full sentences and does not appear short of breath  Cardiovascular:  No lower extremity edema, non tender, no erythema  MSK:  Neck TTP on the left positive FABER neg negative straight leg test noted.  Negative Spurling's test to the left The patient does have some mild pes planus  significant breakdown of the transverse arch  Osteopathic findings  C2 flexed rotated and side bent right C4 flexed rotated and side bent left T3 extended rotated and side bent right inhaled rib T7 extended rotated and side bent left        Assessment and Plan:  DDD (degenerative disc disease), cervical DDD discussed HEP  Discussed worsening pain will need to consider advanced imaging.  Discussed with patient about different medications with patient would like to avoid.  Discussed continuing posture and ergonomics.  If patient has any worsening pain or radicular symptoms to do need to consider advanced imaging as well.  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  Discussed custom orthotics which patient has had about 20 years ago.  I do think it would be significantly beneficial.  Discussed home exercises and icing regimen.  Follow-up again in 6 to 8 weeks    Nonallopathic problems  Decision today to treat with OMT was based on Physical Exam  After verbal consent patient was treated with HVLA, ME, FPR techniques in cervical, rib, thoracic, areas  Patient tolerated the procedure well with improvement in symptoms  Patient given exercises, stretches and lifestyle modifications  See medications in patient instructions if given  Patient will follow up in 8 weeks    The above documentation has been reviewed and is accurate and complete [name], DO           Note: This dictation was prepared with Dragon dictation along with smaller phrase technology. Any transcriptional errors that result from this process are unintentional.

## 2022-12-09 ENCOUNTER — Encounter: Payer: Self-pay | Admitting: Family Medicine

## 2022-12-09 ENCOUNTER — Ambulatory Visit: Payer: Medicare Other | Admitting: Family Medicine

## 2022-12-09 VITALS — BP 112/80 | HR 62 | Ht 67.0 in | Wt 147.0 lb

## 2022-12-09 DIAGNOSIS — M216X9 Other acquired deformities of unspecified foot: Secondary | ICD-10-CM | POA: Diagnosis not present

## 2022-12-09 DIAGNOSIS — M5030 Other cervical disc degeneration, unspecified cervical region: Secondary | ICD-10-CM

## 2022-12-09 DIAGNOSIS — M9901 Segmental and somatic dysfunction of cervical region: Secondary | ICD-10-CM

## 2022-12-09 DIAGNOSIS — M9902 Segmental and somatic dysfunction of thoracic region: Secondary | ICD-10-CM

## 2022-12-09 DIAGNOSIS — M9908 Segmental and somatic dysfunction of rib cage: Secondary | ICD-10-CM | POA: Diagnosis not present

## 2022-12-09 NOTE — Assessment & Plan Note (Addendum)
DDD discussed HEP  Discussed worsening pain will need to consider advanced imaging.  Discussed with patient about different medications with patient would like to avoid.  Discussed continuing posture and ergonomics.  If patient has any worsening pain or radicular symptoms to do need to consider advanced imaging as well.  Patient knows to call us if anything changes otherwise follow-up again in 2 months

## 2022-12-09 NOTE — Patient Instructions (Signed)
If not better in 2 days we will get MRI [place] will be good See me in 6-8 weeks

## 2022-12-09 NOTE — Assessment & Plan Note (Signed)
Breakdown of the transverse arch noted bilaterally.  Discussed custom orthotics which patient has had about 20 years ago.  I do think it would be significantly beneficial.  Discussed home exercises and icing regimen.  Follow-up again in 6 to 8 weeks

## 2022-12-11 ENCOUNTER — Encounter: Payer: Self-pay | Admitting: Gastroenterology

## 2022-12-18 ENCOUNTER — Ambulatory Visit: Payer: Medicare Other | Admitting: Sports Medicine

## 2022-12-18 VITALS — BP 138/84 | Ht 67.0 in | Wt 147.0 lb

## 2022-12-18 DIAGNOSIS — M2142 Flat foot [pes planus] (acquired), left foot: Secondary | ICD-10-CM | POA: Diagnosis not present

## 2022-12-18 DIAGNOSIS — M2140 Flat foot [pes planus] (acquired), unspecified foot: Secondary | ICD-10-CM | POA: Insufficient documentation

## 2022-12-18 DIAGNOSIS — M216X9 Other acquired deformities of unspecified foot: Secondary | ICD-10-CM

## 2022-12-18 DIAGNOSIS — M2141 Flat foot [pes planus] (acquired), right foot: Secondary | ICD-10-CM

## 2022-12-18 NOTE — Progress Notes (Unsigned)
   New Patient Office Visit  Subjective   Patient ID: Molly [name], female    DOB: October 10, 1945  Age: 77 y.o. MRN: [id number]  orthotics.  Ms. Henkelman is here today with chief complaint of some bilateral foot and low back pain requesting new orthotics.  She had a bunionectomy approximately 10 years ago and had some custom orthotics made at that point but has not yet replaced them.  She was sent here for orthotics from Dr. Tamala Julian.  She reports orthotics uses for her walking regimen on the trails.  She denies any recent injury or trauma.  She is here today in her walking tennis shoes.   ROS as listed above in HPI    Objective:     BP 138/84   Ht 5\' 7"  (1.702 m)   Wt 147 lb (66.7 kg)   BMI 23.02 kg/m   Physical Exam Vitals reviewed.  Constitutional:      General: She is not in acute distress.    Appearance: Normal appearance. She is normal weight. She is not ill-appearing, toxic-appearing or diaphoretic.  Pulmonary:     Effort: Pulmonary effort is normal.  Neurological:     Mental Status: She is alert.   Bilateral feet: No obvious deformity.  She does have some pes planus bilaterally with slight hallux valgus of the great toe bilaterally.  On her left foot she has some spreading between her first and second toe.  She has some slight calcaneal valgus bilaterally.  Normal gait.    Assessment & Plan:   Problem List Items Addressed This Visit       Other   Loss of transverse plantar arch    Patient is here today requesting some new orthotics, last custom pair was made about 10 years ago.  We made her a set of custom orthotics with some heel cushion and lateral wedge.  She ambulated in these and felt like they were very comfortable and even helped her back pain.      RESOLVED: Flat foot - Primary   Patient was fitted for a : standard, cushioned, semi-rigid orthotic. The orthotic was heated and afterward the patient stood on the orthotic base positioned on the orthotic  stand. The patient was positioned in subtalar neutral position and 10 degrees of ankle dorsiflexion in a weight bearing stance. After completion of molding, a stable base was applied to the orthotic blank. The base was ground to a stable position for weight bearing. Size: 8 Base: Blue EVA Additional Posting and Padding: Blue [place] heel cushion, lateral wedge bilaterally The patient ambulated these, and they were very comfortable.  Return if symptoms worsen or fail to improve.    Elmore Guise, DO  Addendum:  I was the preceptor for this visit and available for immediate consultation.  Karlton Lemon MD Kirt Boys

## 2022-12-18 NOTE — Assessment & Plan Note (Signed)
Patient is here today requesting some new orthotics, last custom pair was made about 10 years ago.  We made her a set of custom orthotics with some heel cushion and lateral wedge.  She ambulated in these and felt like they were very comfortable and even helped her back pain.

## 2023-01-16 NOTE — Progress Notes (Unsigned)
  Molly [name] [address] [place] [address] Phone: [phone] Subjective:   [name], am serving as a scribe for Dr. Antoine Primas.  I'm seeing this patient by the request  of:  Lauro Regulus, MD  CC: Back and neck pain follow-up  [id number]  Molly [name] is a 77 y.o. female coming in with complaint of back and neck pain. OMT on 12/09/2022. Patient states overall having some difficulty.  Still having some tightness noted in the left shoulder.  Medications patient has been prescribed:   Taking:         Reviewed prior external information including notes and imaging from previsou exam, outside providers and external EMR if available.   As well as notes that were available from care everywhere and other healthcare systems.  Past medical history, social, surgical and family history all reviewed in electronic medical record.  No pertanent information unless stated regarding to the chief complaint.   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 POSITIVE muscle aches  Objective  Height  (1.702 m).   General: No apparent distress alert and oriented x3 mood and affect normal, dressed appropriately.  HEENT: Pupils equal, extraocular movements intact  Respiratory: Patient's speak in full sentences and does not appear short of breath  Cardiovascular: No lower extremity edema, non tender, no erythema  Neck exam does have some  loss lordosis noted.  Some tightness noted with sidebending bilaterally.  Osteopathic findings  C2 flexed rotated and side bent right C4 flexed rotated and side bent right C6 flexed rotated and side bent left T3 extended rotated and side bent right inhaled rib T9 extended rotated and side bent left L2 flexed rotated and side bent right Sacrum right on right       Assessment and Plan:  DDD (degenerative disc disease), cervical Chronic problem with worsening symptoms again.  Discussed icing regimen and home exercises, which activities to do and which ones to avoid, increase activity slowly.  Follow-up again in 6 to 8 weeks discussed which medications as well.    Nonallopathic problems  Decision today to treat with OMT was based on Physical Exam  After verbal consent patient was treated with HVLA, ME, FPR techniques in cervical, rib, thoracic, lumbar, and sacral  areas  Patient tolerated the procedure well with improvement in symptoms  Patient given exercises, stretches and lifestyle modifications  See medications in patient instructions if given  Patient will follow up in 4-8 weeks    The above documentation has been reviewed and is accurate and complete Judi Saa, DO          Note: This dictation was prepared with Dragon dictation along with smaller phrase technology. Any transcriptional errors that result from this process are unintentional.

## 2023-01-20 ENCOUNTER — Encounter: Payer: Self-pay | Admitting: Family Medicine

## 2023-01-20 ENCOUNTER — Ambulatory Visit: Payer: Medicare Other | Admitting: Family Medicine

## 2023-01-20 VITALS — Ht 67.0 in

## 2023-01-20 DIAGNOSIS — M9904 Segmental and somatic dysfunction of sacral region: Secondary | ICD-10-CM | POA: Diagnosis not present

## 2023-01-20 DIAGNOSIS — M5030 Other cervical disc degeneration, unspecified cervical region: Secondary | ICD-10-CM

## 2023-01-20 DIAGNOSIS — M9902 Segmental and somatic dysfunction of thoracic region: Secondary | ICD-10-CM

## 2023-01-20 DIAGNOSIS — M9908 Segmental and somatic dysfunction of rib cage: Secondary | ICD-10-CM

## 2023-01-20 DIAGNOSIS — M9903 Segmental and somatic dysfunction of lumbar region: Secondary | ICD-10-CM

## 2023-01-20 DIAGNOSIS — M9901 Segmental and somatic dysfunction of cervical region: Secondary | ICD-10-CM

## 2023-01-20 NOTE — Assessment & Plan Note (Signed)
Chronic problem with worsening symptoms again.  Discussed icing regimen and home exercises, which activities to do and which ones to avoid, increase activity slowly.  Follow-up again in 6 to 8 weeks discussed which medications as well.

## 2023-02-26 ENCOUNTER — Other Ambulatory Visit: Payer: Self-pay | Admitting: Gastroenterology

## 2023-02-26 ENCOUNTER — Other Ambulatory Visit (HOSPITAL_COMMUNITY): Payer: Self-pay

## 2023-02-26 ENCOUNTER — Telehealth: Payer: Self-pay | Admitting: Pharmacy Technician

## 2023-02-26 DIAGNOSIS — K52831 Collagenous colitis: Secondary | ICD-10-CM

## 2023-02-26 NOTE — Telephone Encounter (Signed)
Patient Advocate Encounter  Received notification from [facility] that prior authorization for BUDESONIDE 3MG  is required.   PA submitted on 5.29.24 Key [id number] Status is pending

## 2023-02-26 NOTE — Telephone Encounter (Signed)
Patient Advocate Encounter  Prior Authorization for BUDESONIDE 3MG  has been approved with [employer].    PA# [id number]  Effective dates: 5.29.24 through 5.29.25  Per [facility] test claim, copay for 30 days supply is $22.56

## 2023-03-03 NOTE — Progress Notes (Unsigned)
[name] [address] [place] [address] Phone: [phone] Subjective:   Molly [name], am serving as a scribe for Dr. Antoine Primas.  I'm seeing this patient by the request  of:  Lauro Regulus, MD  CC: Back and neck pain follow-up  [id number]  Molly [name] is a 77 y.o. female coming in with complaint of back and neck pain. OMT on 01/20/2023. Patient states continues to have some discomfort as well.  Describes it as a dull, throbbing aching discomfort.  Medications patient has been prescribed:   Taking:         Reviewed prior external information including notes and imaging from previsou exam, outside providers and external EMR if available.   As well as notes that were available from care everywhere and other healthcare systems.  Past medical history, social, surgical and family history all reviewed in electronic medical record.  No pertanent information unless stated regarding to the chief complaint.   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 POSITIVE muscle aches  Objective  Blood pressure 110/82, pulse 71, height 5\' 7"  (1.702 m), weight 146 lb (66.2 kg), SpO2 98 %.   General: No apparent distress alert and oriented x3 mood and affect normal, dressed appropriately.  HEENT: Pupils equal, extraocular movements intact  Respiratory: Patient's speak in full sentences and does not appear short of breath  Cardiovascular:  No lower extremity edema, non tender, no erythema  Low back pain noted.  Neck exam does have some loss of lordosis.  Tender to palpation with sidebending bilaterally.  Crepitus noted.  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  Discussed icing regimen and home exercises, discussed which activities to do and which ones to avoid, increase activity slowly no change in medications at the moment.  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  Follow-up again in 6 to 8 weeks.    Nonallopathic problems  Decision today to treat with OMT was based on Physical Exam  After verbal consent patient was treated with HVLA, ME, FPR techniques in cervical, rib, thoracic, lumbar, and sacral  areas  Patient tolerated the procedure well with improvement in symptoms  Patient given exercises, stretches and lifestyle modifications  See medications in patient instructions if given  Patient will follow up in 4-8 weeks    The above documentation has been reviewed and is accurate and complete Judi Saa, DO          Note: This dictation was prepared with Dragon dictation along with smaller phrase technology. Any transcriptional errors that  result from this process are unintentional.

## 2023-03-05 ENCOUNTER — Ambulatory Visit: Payer: Medicare Other | Admitting: Family Medicine

## 2023-03-05 ENCOUNTER — Encounter: Payer: Self-pay | Admitting: Family Medicine

## 2023-03-05 VITALS — BP 110/82 | HR 71 | Ht 67.0 in | Wt 146.0 lb

## 2023-03-05 DIAGNOSIS — M5030 Other cervical disc degeneration, unspecified cervical region: Secondary | ICD-10-CM

## 2023-03-05 DIAGNOSIS — M9902 Segmental and somatic dysfunction of thoracic region: Secondary | ICD-10-CM

## 2023-03-05 DIAGNOSIS — M9904 Segmental and somatic dysfunction of sacral region: Secondary | ICD-10-CM

## 2023-03-05 DIAGNOSIS — M9901 Segmental and somatic dysfunction of cervical region: Secondary | ICD-10-CM | POA: Diagnosis not present

## 2023-03-05 DIAGNOSIS — M9908 Segmental and somatic dysfunction of rib cage: Secondary | ICD-10-CM

## 2023-03-05 DIAGNOSIS — M9903 Segmental and somatic dysfunction of lumbar region: Secondary | ICD-10-CM

## 2023-03-05 NOTE — Assessment & Plan Note (Addendum)
Degenerative disc disease of the neck noted.  Discussed icing regimen and home exercises, discussed which activities to do and which ones to avoid, increase activity slowly no change in medications at the moment.  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  Follow-up again in 6 to 8 weeks.

## 2023-03-05 NOTE — Patient Instructions (Signed)
Keep doing exercises You are making progress Used a new move today See you again in 2 months

## 2023-03-21 ENCOUNTER — Encounter: Payer: Self-pay | Admitting: Gastroenterology

## 2023-03-21 ENCOUNTER — Other Ambulatory Visit: Payer: Self-pay | Admitting: Gastroenterology

## 2023-03-21 ENCOUNTER — Ambulatory Visit: Payer: Medicare Other | Admitting: Gastroenterology

## 2023-03-21 ENCOUNTER — Other Ambulatory Visit (INDEPENDENT_AMBULATORY_CARE_PROVIDER_SITE_OTHER): Payer: Medicare Other

## 2023-03-21 ENCOUNTER — Ambulatory Visit (INDEPENDENT_AMBULATORY_CARE_PROVIDER_SITE_OTHER)
Admission: RE | Admit: 2023-03-21 | Discharge: 2023-03-21 | Disposition: A | Discharge status: Home / Self Care | Payer: Medicare Other | Source: Ambulatory Visit | Attending: Gastroenterology | Admitting: Gastroenterology

## 2023-03-21 VITALS — BP 120/70 | HR 63 | Ht 67.0 in | Wt 145.0 lb

## 2023-03-21 DIAGNOSIS — R3915 Urgency of urination: Secondary | ICD-10-CM | POA: Diagnosis not present

## 2023-03-21 DIAGNOSIS — K52831 Collagenous colitis: Secondary | ICD-10-CM | POA: Diagnosis not present

## 2023-03-21 DIAGNOSIS — Z86010 Personal history of colonic polyps: Secondary | ICD-10-CM

## 2023-03-21 DIAGNOSIS — N3090 Cystitis, unspecified without hematuria: Secondary | ICD-10-CM

## 2023-03-21 LAB — URINALYSIS, ROUTINE W REFLEX MICROSCOPIC
Bilirubin Urine: NEGATIVE
Ketones, ur: NEGATIVE
Nitrite: POSITIVE — AB
Specific Gravity, Urine: 1.01 (ref 1.000–1.030)
Total Protein, Urine: NEGATIVE
Urine Glucose: NEGATIVE
Urobilinogen, UA: 0.2 (ref 0.0–1.0)
pH: 6 (ref 5.0–8.0)

## 2023-03-21 MED ORDER — NITROFURANTOIN MONOHYD MACRO 100 MG PO CAPS: 100.0000 mg | ORAL_CAPSULE | Freq: Two times a day (BID) | ORAL | 0 refills | Status: AC

## 2023-03-21 NOTE — Patient Instructions (Signed)
Please go to the lab in the basement of our building to have lab work done as you leave today. Hit "B" for basement when you get on the elevator.  When the doors open the lab is on your left.  We will call you with the results. Thank you.  Please go to Radiology in the basement for a Dexa Scan today.  If you want to schedule an appointment you can call [phone].  Thank you for entrusting me with your care and for choosing [facility], Dr. Ileene Patrick    If your blood pressure at your visit was 140/90 or greater, please contact your primary care physician to follow up on this. ______________________________________________________  If you are age 44 or older, your body mass index should be between 23-30. Your Body mass index is 22.71 kg/m. If this is out of the aforementioned range listed, please consider follow up with your Primary Care Provider.  If you are age 71 or younger, your body mass index should be between 19-25. Your Body mass index is 22.71 kg/m. If this is out of the aformentioned range listed, please consider follow up with your Primary Care Provider.  ________________________________________________________  The [facility] providers would like to encourage you to use [facility] to communicate with providers for non-urgent requests or questions.  Due to long hold times on the telephone, sending your provider a message by [facility] may be a faster and more efficient way to get a response.  Please allow 48 business hours for a response.  Please remember that this is for non-urgent requests.  _______________________________________________________  Due to recent changes in healthcare laws, you may see the results of your imaging and laboratory studies on MyChart before your provider has had a chance to review them.  We understand that in some cases there may be results that are confusing or concerning to you. Not all laboratory results come back in the same time frame and the  provider may be waiting for multiple results in order to interpret others.  Please give [place] 48 hours in order for your provider to thoroughly review all the results before contacting the office for clarification of your results.

## 2023-03-21 NOTE — Progress Notes (Signed)
HPI :  77 y/o female, here for a follow up visit for history of collagenous colitis and colon polyps.  She previously was followed by Dr. Orvan Falconer.  She was last seen in May 2023.  Recall she had diarrhea for several years.  She had problem with fecal urgency and incontinence.  She also had a positive Cologuard which led to colonoscopy with Dr. Orvan Falconer in March 2022.  She was found to have collagenous colitis.  She was also found to have an appendiceal orifice polyp, small adenoma that could not be removed endoscopically.  She subsequently had surgical resection with Dr. Cliffton Asters.  Interestingly surgical specimen was negative for any adenomatous tissue.  She states budesonide has worked really well for her over time.  She denied any NSAID use or other medications at the time that would be causing this for her.  She started on higher dose and then wean down over time.  Unfortunately she has not been able to come off of budesonide without recurrent symptoms.  She states she has not tried Imodium, bismuth, or cholestyramine/Colestid or alternatives.  She has been very happy with using low-dose budesonide at 3 mg daily.  She inquires about long-term risks.  While on the regimen she has 1-2 bowel movements per day without blood, without urgency, no fecal incontinence.  Before she was having several bowel movements today with uncontrolled urgency leading to incontinence.  She had symptoms for 1 to 2 years before her diagnosis.  Again she does not take any aspirin or NSAIDs.  No new medication changes.  She states she had a DEXA scan over 20 years ago, she has not been checked for osteoporosis recently.  Her mother had colitis but she does not know what form.  She has previously been tested for celiac disease and negative.  She otherwise endorses some increased urinary urgency and frequency in the past few weeks.  She states she has not had a UTI before but wonders if she has been given her symptoms.  We also  discussed if she wanted any further surveillance colonoscopy.  She strongly does not wish to have a colonoscopy if possible.  Endoscopic history: - Has had 3 colonoscopies. The first two with Dr. Luan Pulling and small polyps were removed.  She does not believe these were precancerous polyps.  The procedures were performed without incident.   - Surveillance colonoscopy with Dr. Marva Panda was attempted 06/26/2015 but could not be performed due to poor prep.  Was successfully completed 06/27/2015 for personal history of colon polyps revealed 2 small rectosigmoid hyperplastic polyps, pancolonic diverticulosis, and a redundant sigmoid colon.  - Colonoscopy 11/29/20 revealed a tubular adenoma at the AO and collagenous colitis.  A hyperplastic polyp was removed from the sigmoid colon.   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    Upper respiratory infection    2023     Past Surgical History:  Procedure Laterality Date   CATARACT EXTRACTION Bilateral    COLONOSCOPY WITH PROPOFOL N/A 06/26/2015   Procedure: COLONOSCOPY WITH PROPOFOL;  Surgeon: Christena Deem, MD;  Location: [facility] ENDOSCOPY;  Service: Endoscopy;  Laterality: N/A;   COLONOSCOPY WITH PROPOFOL N/A 06/27/2015   Procedure: COLONOSCOPY WITH PROPOFOL;  Surgeon:  Christena Deem, MD;  Location: [facility] ENDOSCOPY;  Service: Endoscopy;  Laterality: N/A;   dental implants     LAPAROSCOPIC APPENDECTOMY N/A 02/07/2022   Procedure: APPENDECTOMY LAPAROSCOPIC;  Surgeon: Andria Meuse, MD;  Location: [facility] ORS;  Service: General;  Laterality: N/A;   REFRACTIVE SURGERY     TONSILLECTOMY     Family History  Problem Relation Age of Onset   Other Father        amilodosis   Colon cancer Neg Hx    Esophageal cancer Neg Hx    Rectal cancer Neg Hx    Stomach cancer Neg  Hx    Social History   Tobacco Use   Smoking status: Every Day    Packs/day: .5    Types: Cigarettes   Smokeless tobacco: Never  Vaping Use   Vaping Use: Never used  Substance Use Topics   Alcohol use: Yes    Alcohol/week: 1.0 standard drink of alcohol    Types: 1 Glasses of wine per week    Comment: 2 glasses of wine a week   Drug use: Never   Current Outpatient Medications  Medication Sig Dispense Refill   atorvastatin (LIPITOR) 20 MG tablet Take 20 mg by mouth daily.     budesonide (ENTOCORT EC) 3 MG 24 hr capsule TAKE ONE CAPSULE BY MOUTH ONCE DAILY 30 capsule 0   Cholecalciferol 2000 UNITS CAPS Take 4,000 Units by mouth every morning.     citalopram (CELEXA) 10 MG tablet Take 10 mg by mouth daily.     Multiple Vitamins-Minerals (CENTRUM SILVER PO) Take 1 tablet by mouth daily.     Probiotic Product (PROBIOTIC PO) Take 1 tablet by mouth daily. Physician's Choice     No current facility-administered medications for this visit.   No Known Allergies   Review of Systems: All systems reviewed and negative except where noted in HPI.   Lab Results  Component Value Date   WBC 7.0 01/21/2022   HGB 14.1 01/21/2022   HCT 42.7 01/21/2022   MCV 92.4 01/21/2022   PLT 316 01/21/2022    Lab Results  Component Value Date   CREATININE 0.72 12/10/2021   BUN 17 12/10/2021   NA 140 12/10/2021   K 4.6 12/10/2021   CL 105 12/10/2021   CO2 25 12/10/2021    Lab Results  Component Value Date   ALT 21 12/10/2021   AST 25 12/10/2021   ALKPHOS 59 12/10/2021   BILITOT 0.5 12/10/2021     Physical Exam: BP 120/70   Pulse 63   Ht 5\' 7"  (1.702 m)   Wt 145 lb (65.8 kg)   BMI 22.71 kg/m  Constitutional: Pleasant,well-developed, female in no acute distress. Neurological: Alert and oriented to person place and time. Psychiatric: Normal mood and affect. Behavior is normal.   ASSESSMENT: 77 y.o. female here for assessment of the following  1. Collagenous colitis   2.  History of colon polyps   3. Urinary urgency    Diagnosis of collagenous colitis which is quite responsive to budesonide.  Unclear etiology of this, negative for celiac disease, does not take any medicines that would commonly cause this.  She has responded very well to budesonide, unfortunately if she stops that she has recurrent symptoms.  We discussed options.  Using low-dose budesonide is an option as it works very well for her, however there is some systemic absorption, risk for osteoporosis, etc.  She has not had a bone density scan in some time  and I think reasonable to get a baseline DEXA as we make decision about her long-term regimen.  She is agreeable with that.  We discussed other options to include Imodium, bismuth, Colestid/cholestyramine if she want to try those.  She is agreeable to start titrating down to budesonide to maybe take it every other day for some time, she can supplement this with Imodium as needed.  She will try doing this and hopefully use the lowest daily dose needed to control symptoms.  That being said if she has recurrent incontinence and having a really hard time and does not want to try the alternative she can go back to taking it daily, understanding the risks of long-term steroids.  I will see her once yearly for this issue moving forward, or sooner with any issues.  We will await her DEXA scan with further recommendations.  We discussed at her age if she wanted any further surveillance exams.  She had 1 small adenoma at the AO and subsequent surgical resection which showed no residual polypoid tissue.  She does not wish to have any further surveillance at this point in her life.  With 1 small adenoma on her last exam per national guidelines she will be due 7 years from her last exam and she is not want to have a colonoscopy at that time or sooner if she really needs it.  Otherwise, she is having some urinary frequency/urgency lately, concerning for possible UTI.  Will  order urinalysis with reflex culture and treat if needed.   PLAN: - counseled on long term steroid use, use lowest dose of budesonide needed to control colitis or use of an alternative - she will try to wean down and use budesonide 3mg  every other day and immodium on the other days. Also offered bismuth or colestid if needed. Want to avoid biologic - DEXA scan to screen for osteoporosis - urinalysis with reflex culture for urinary symptoms - she wants to forgo further surveillance colonoscopy. One small adenoma on the last exam at [facility] and subsequent surgical resection.  Harlin Rain, MD [facility]

## 2023-04-18 ENCOUNTER — Other Ambulatory Visit: Payer: Self-pay | Admitting: Gastroenterology

## 2023-04-18 DIAGNOSIS — K52831 Collagenous colitis: Secondary | ICD-10-CM

## 2023-04-30 NOTE — Progress Notes (Signed)
  [name] [address] [place] [address] Phone: [phone] Subjective:   [name], am serving as a scribe for Dr. Antoine Primas.  I'm seeing this patient by the request  of:  Molly Regulus, MD  CC: back and neck pain   [id number]  Molly [name] is a 77 y.o. female coming in with complaint of back and neck pain. OMT on 03/05/2023. Patient states doing well. No new concerns.         Reviewed prior external information including notes and imaging from previsou exam, outside providers and external EMR if available.   As well as notes that were available from care everywhere and other healthcare systems.  Past medical history, social, surgical and family history all reviewed in electronic medical record.  No pertanent information unless stated regarding to the chief complaint.   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 POSITIVE muscle aches  Objective  Blood pressure 112/70, pulse 69, height 5\' 7"  (1.702 m), weight 144 lb (65.3 kg), SpO2 97%.   General: No apparent distress alert and oriented x3 mood and affect normal, dressed appropriately.  HEENT: Pupils equal, extraocular movements intact  Respiratory: Patient's speak in full sentences and does not appear short of breath  Cardiovascular: No lower extremity edema, non tender, no erythema    Osteopathic findings  C2 flexed rotated and side bent  right C7 flexed rotated and side bent left T3 extended rotated and side bent right inhaled rib T6 extended rotated and side bent left L3 flexed rotated and side bent right Sacrum right on right       Assessment and Plan:  Low back pain Arthritic changes noted. Discussed HEP  Discussed avoiding certain activity that can exacerbate.  Return to clinic again in 6 to 8 weeks    Nonallopathic problems  Decision today to treat with OMT was based on Physical Exam  After verbal consent patient was treated with HVLA, ME, FPR techniques in cervical, rib, thoracic, lumbar, and sacral  areas  Patient tolerated the procedure well with improvement in symptoms  Patient given exercises, stretches and lifestyle modifications  See medications in patient instructions if given  Patient will follow up in 4-8 weeks     The above documentation has been reviewed and is accurate and complete Molly Saa, DO         Note: This dictation was prepared with Dragon dictation along with smaller phrase technology. Any transcriptional errors that result from this process are unintentional.

## 2023-05-06 ENCOUNTER — Ambulatory Visit: Payer: Medicare Other | Admitting: Family Medicine

## 2023-05-06 ENCOUNTER — Encounter: Payer: Self-pay | Admitting: Family Medicine

## 2023-05-06 VITALS — BP 112/70 | HR 69 | Ht 67.0 in | Wt 144.0 lb

## 2023-05-06 DIAGNOSIS — M9901 Segmental and somatic dysfunction of cervical region: Secondary | ICD-10-CM

## 2023-05-06 DIAGNOSIS — M9903 Segmental and somatic dysfunction of lumbar region: Secondary | ICD-10-CM

## 2023-05-06 DIAGNOSIS — M9902 Segmental and somatic dysfunction of thoracic region: Secondary | ICD-10-CM

## 2023-05-06 DIAGNOSIS — M9908 Segmental and somatic dysfunction of rib cage: Secondary | ICD-10-CM | POA: Diagnosis not present

## 2023-05-06 DIAGNOSIS — M9904 Segmental and somatic dysfunction of sacral region: Secondary | ICD-10-CM | POA: Diagnosis not present

## 2023-05-06 DIAGNOSIS — M545 Low back pain, unspecified: Secondary | ICD-10-CM

## 2023-05-06 NOTE — Patient Instructions (Addendum)
Good to see you! See you again in 6-8 weeks 

## 2023-05-06 NOTE — Assessment & Plan Note (Signed)
Arthritic changes noted. Discussed HEP  Discussed avoiding certain activity that can exacerbate.  Return to clinic again in 6 to 8 weeks

## 2023-05-08 ENCOUNTER — Encounter: Payer: Self-pay | Admitting: Family Medicine

## 2023-05-17 ENCOUNTER — Other Ambulatory Visit: Payer: Self-pay | Admitting: Gastroenterology

## 2023-05-17 DIAGNOSIS — K52831 Collagenous colitis: Secondary | ICD-10-CM

## 2023-06-24 NOTE — Progress Notes (Unsigned)
  [name] [address] [place] [address] Phone: [phone] Subjective:   Molly [name], am serving as a scribe for Dr. Antoine Primas.  I'm seeing this patient by the request  of:  Lauro Regulus, MD  CC: Back and neck pain follow-up  [id number]  Molly [name] is a 77 y.o. female coming in with complaint of back and neck pain. OMT on 05/06/2023. Patient states same per usual. Wants to talk restless leg syndome. No other concerns.          Reviewed prior external information including notes and imaging from previsou exam, outside providers and external EMR if available.   As well as notes that were available from care everywhere and other healthcare systems.  Past medical history, social, surgical and family history all reviewed in electronic medical record.  No pertanent information unless stated regarding to the chief complaint.   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 POSITIVE muscle aches  Objective  Blood pressure 116/78, pulse 63, height 5\' 7"  (1.702 m), weight 142 lb (64.4 kg), SpO2 94%.   General: No apparent distress alert and oriented x3 mood and affect normal, dressed appropriately.  HEENT: Pupils equal, extraocular movements intact  Respiratory: Patient's speak in full sentences and does not appear short of breath  Cardiovascular: No lower extremity edema, non tender, no erythema  Patient  neck exam still has some tightness noted.  Severe tenderness in the right sacroiliac joint noted.  Osteopathic findings  C2 flexed rotated and side bent right C6 flexed rotated and side bent left T3 extended rotated and side bent right inhaled rib T9 extended rotated and side bent left L2 flexed rotated and side bent right L5 flexed rotated and side bent right Sacrum right on right     Assessment and Plan:  DDD (degenerative disc disease), cervical Stable overall.  Still has tightness.  Nothing that that is stopping her from activity.  Has had some difficulty with her left hip as well and we will continue to monitor.  Responds well though to osteopathic manipulation.  Follow-up again in 6 weeks    Nonallopathic problems  Decision today to treat with OMT was based on Physical Exam  After verbal consent patient was treated with HVLA, ME, FPR techniques in cervical, rib, thoracic, lumbar, and sacral  areas  Patient tolerated the procedure well with improvement in symptoms  Patient given exercises, stretches and lifestyle modifications  See medications in patient instructions if given  Patient will follow up in 4-8 weeks     The above documentation has been reviewed and is accurate and complete Molly Saa, DO         Note: This dictation was prepared with Dragon dictation along with smaller phrase technology. Any transcriptional errors that result from this process are unintentional.

## 2023-06-25 ENCOUNTER — Ambulatory Visit: Payer: Medicare Other | Admitting: Family Medicine

## 2023-06-25 ENCOUNTER — Encounter: Payer: Self-pay | Admitting: Family Medicine

## 2023-06-25 VITALS — BP 116/78 | HR 63 | Ht 67.0 in | Wt 142.0 lb

## 2023-06-25 DIAGNOSIS — M9908 Segmental and somatic dysfunction of rib cage: Secondary | ICD-10-CM

## 2023-06-25 DIAGNOSIS — M5030 Other cervical disc degeneration, unspecified cervical region: Secondary | ICD-10-CM

## 2023-06-25 DIAGNOSIS — M9902 Segmental and somatic dysfunction of thoracic region: Secondary | ICD-10-CM

## 2023-06-25 DIAGNOSIS — M9904 Segmental and somatic dysfunction of sacral region: Secondary | ICD-10-CM

## 2023-06-25 DIAGNOSIS — M9901 Segmental and somatic dysfunction of cervical region: Secondary | ICD-10-CM | POA: Diagnosis not present

## 2023-06-25 DIAGNOSIS — M9903 Segmental and somatic dysfunction of lumbar region: Secondary | ICD-10-CM

## 2023-06-25 NOTE — Patient Instructions (Signed)
Good to see you! See you again in 2 months

## 2023-06-25 NOTE — Assessment & Plan Note (Signed)
Stable overall.  Still has tightness.  Nothing that that is stopping her from activity.  Has had some difficulty with her left hip as well and we will continue to monitor.  Responds well though to osteopathic manipulation.  Follow-up again in 6 weeks

## 2023-08-06 ENCOUNTER — Other Ambulatory Visit: Payer: Self-pay | Admitting: Gastroenterology

## 2023-08-06 DIAGNOSIS — K52831 Collagenous colitis: Secondary | ICD-10-CM

## 2023-08-20 ENCOUNTER — Ambulatory Visit: Payer: Medicare Other | Admitting: Family Medicine

## 2023-08-20 NOTE — Progress Notes (Unsigned)
[name] [address] [place] [address] Phone: [phone] Subjective:   Bruce Donath, am serving as a scribe for Dr. Antoine Primas.  I'm seeing this patient by the request  of:  Lauro Regulus, MD  CC: Neck exam and pain in the lower back  [id number]  ROMAN SENGER is a 77 y.o. female coming in with complaint of back and neck pain. OMT 06/25/2023. Patient states that she pulled a muscle in R side of lower back. Painful to extend back. Has sharp pain at times in that area.   Medications patient has been prescribed: None  Taking:         Reviewed prior external information including notes and imaging from previsou exam, outside providers and external EMR if available.   As well as notes that were available from care everywhere and other healthcare systems.  Past medical history, social, surgical and family history all reviewed in electronic medical record.  No pertanent information unless stated regarding to the chief complaint.   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 POSITIVE muscle aches  Objective  Blood pressure 112/82, pulse 88, height 5\' 7"  (1.702 m), SpO2 96%.   General: No apparent distress alert and oriented x3 mood and affect normal, dressed appropriately.  HEENT: Pupils equal, extraocular movements intact  Respiratory: Patient's speak in full sentences and does not appear short of  breath  Cardiovascular: No lower extremity edema, non tender, no erythema  MSK:  Back low back does have his tightness noted over the sacroiliac.  Tightness with Pearlean Brownie on the right.  Neck exam does have some limited sidebending.  Osteopathic findings  C2 flexed rotated and side bent right C7 flexed rotated and side bent right T3 extended rotated and side bent right inhaled rib T7 extended rotated and side bent left L2 flexed rotated and side bent right L5 flexed rotated and side bent right Sacrum right on right    Assessment and Plan:  DDD (degenerative disc disease), cervical Degenerative disc disease noted.  Continue to work on [employer] cabin [employer].  Discussed avoiding certain activities, patient has had some stress in her life that may have contributed and we discussed ergonomics closely.  No change in medications.  Continue other modalities such as the massage.  Follow-up again in 6 to 8 weeks otherwise.    Nonallopathic problems  Decision today to treat with OMT was based on Physical Exam  After verbal consent patient was treated with HVLA, ME, FPR techniques in cervical, rib, thoracic, lumbar, and sacral  areas  Patient tolerated the procedure well with improvement in symptoms  Patient given exercises, stretches and lifestyle modifications  See medications in patient instructions if given  Patient will follow up in 4-8 weeks    The above documentation has been reviewed and is accurate and complete Judi Saa, DO  Note: This dictation was prepared with Dragon dictation along with smaller phrase technology. Any transcriptional errors that result from this process are unintentional.

## 2023-08-21 ENCOUNTER — Ambulatory Visit: Payer: Medicare Other | Admitting: Family Medicine

## 2023-08-21 ENCOUNTER — Encounter: Payer: Self-pay | Admitting: Family Medicine

## 2023-08-21 VITALS — BP 112/82 | HR 88 | Ht 67.0 in

## 2023-08-21 DIAGNOSIS — M9908 Segmental and somatic dysfunction of rib cage: Secondary | ICD-10-CM

## 2023-08-21 DIAGNOSIS — M9904 Segmental and somatic dysfunction of sacral region: Secondary | ICD-10-CM | POA: Diagnosis not present

## 2023-08-21 DIAGNOSIS — M5030 Other cervical disc degeneration, unspecified cervical region: Secondary | ICD-10-CM | POA: Diagnosis not present

## 2023-08-21 DIAGNOSIS — M9902 Segmental and somatic dysfunction of thoracic region: Secondary | ICD-10-CM | POA: Diagnosis not present

## 2023-08-21 DIAGNOSIS — M9903 Segmental and somatic dysfunction of lumbar region: Secondary | ICD-10-CM

## 2023-08-21 DIAGNOSIS — M9901 Segmental and somatic dysfunction of cervical region: Secondary | ICD-10-CM | POA: Diagnosis not present

## 2023-08-21 NOTE — Assessment & Plan Note (Signed)
Degenerative disc disease noted.  Continue to work on [employer] cabin [employer].  Discussed avoiding certain activities, patient has had some stress in her life that may have contributed and we discussed ergonomics closely.  No change in medications.  Continue other modalities such as the massage.  Follow-up again in 6 to 8 weeks otherwise.

## 2023-08-21 NOTE — Patient Instructions (Signed)
See me in 2 months 

## 2023-09-01 ENCOUNTER — Encounter: Payer: Self-pay | Admitting: Family Medicine

## 2023-09-01 MED ORDER — TIZANIDINE HCL 2 MG PO TABS: 2.0000 mg | ORAL_TABLET | Freq: Three times a day (TID) | ORAL | 1 refills | Status: AC | PRN

## 2023-09-16 ENCOUNTER — Other Ambulatory Visit: Payer: Self-pay | Admitting: Family Medicine

## 2023-09-19 IMAGING — DX DG LUMBAR SPINE 2-3V
3 series · 3 of 3 positions shown · non-contrast
Comparison: None.

CLINICAL DATA: Chronic back pain

EXAM:
LUMBAR SPINE - 2-3 VIEW

[l-spine ap]
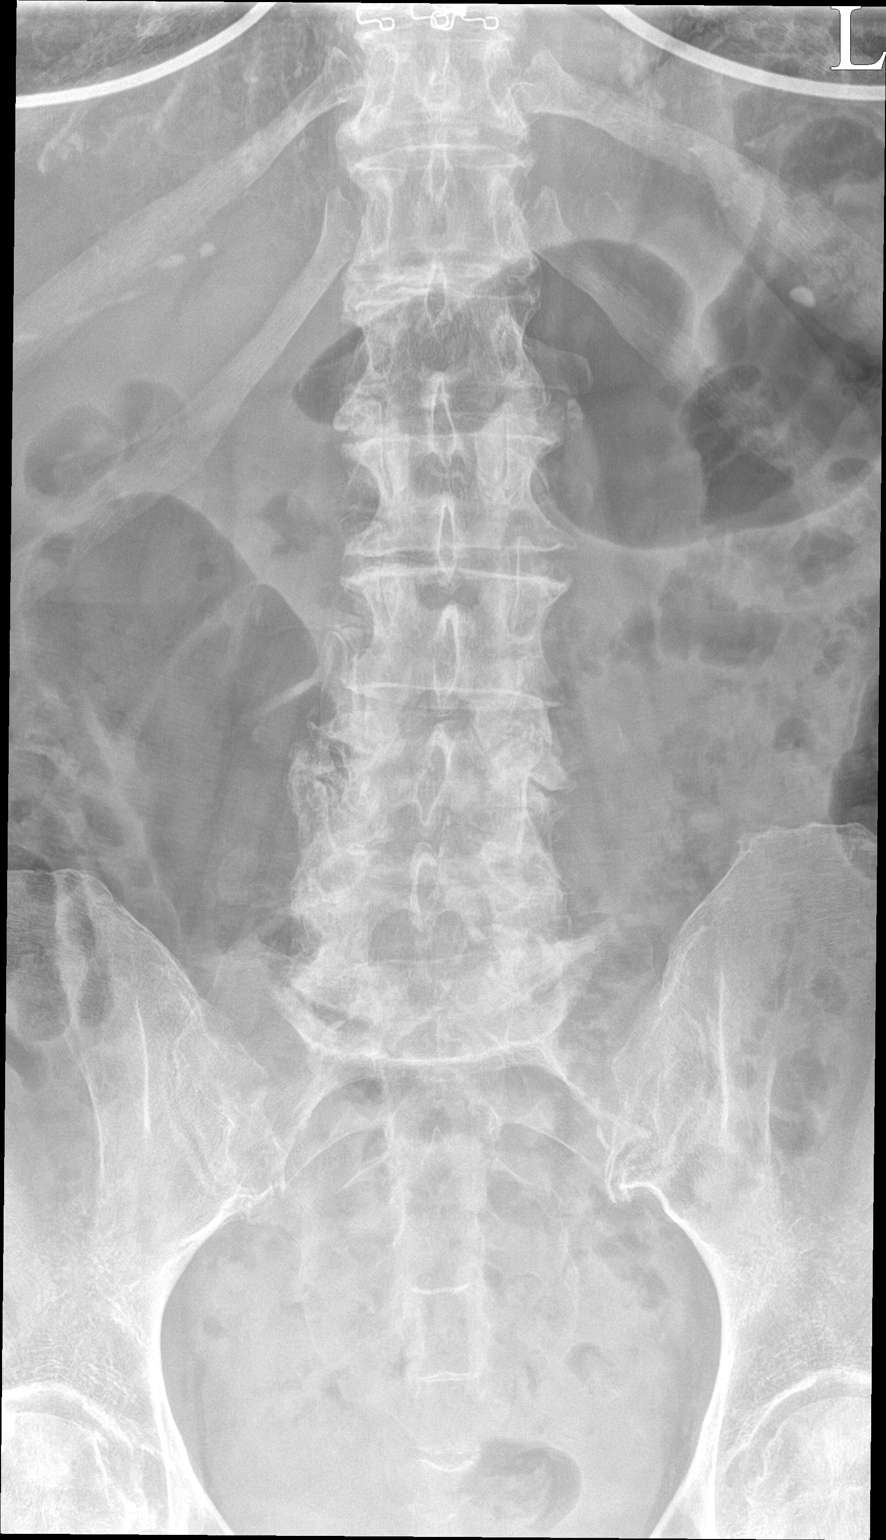

[l-spine lateral]
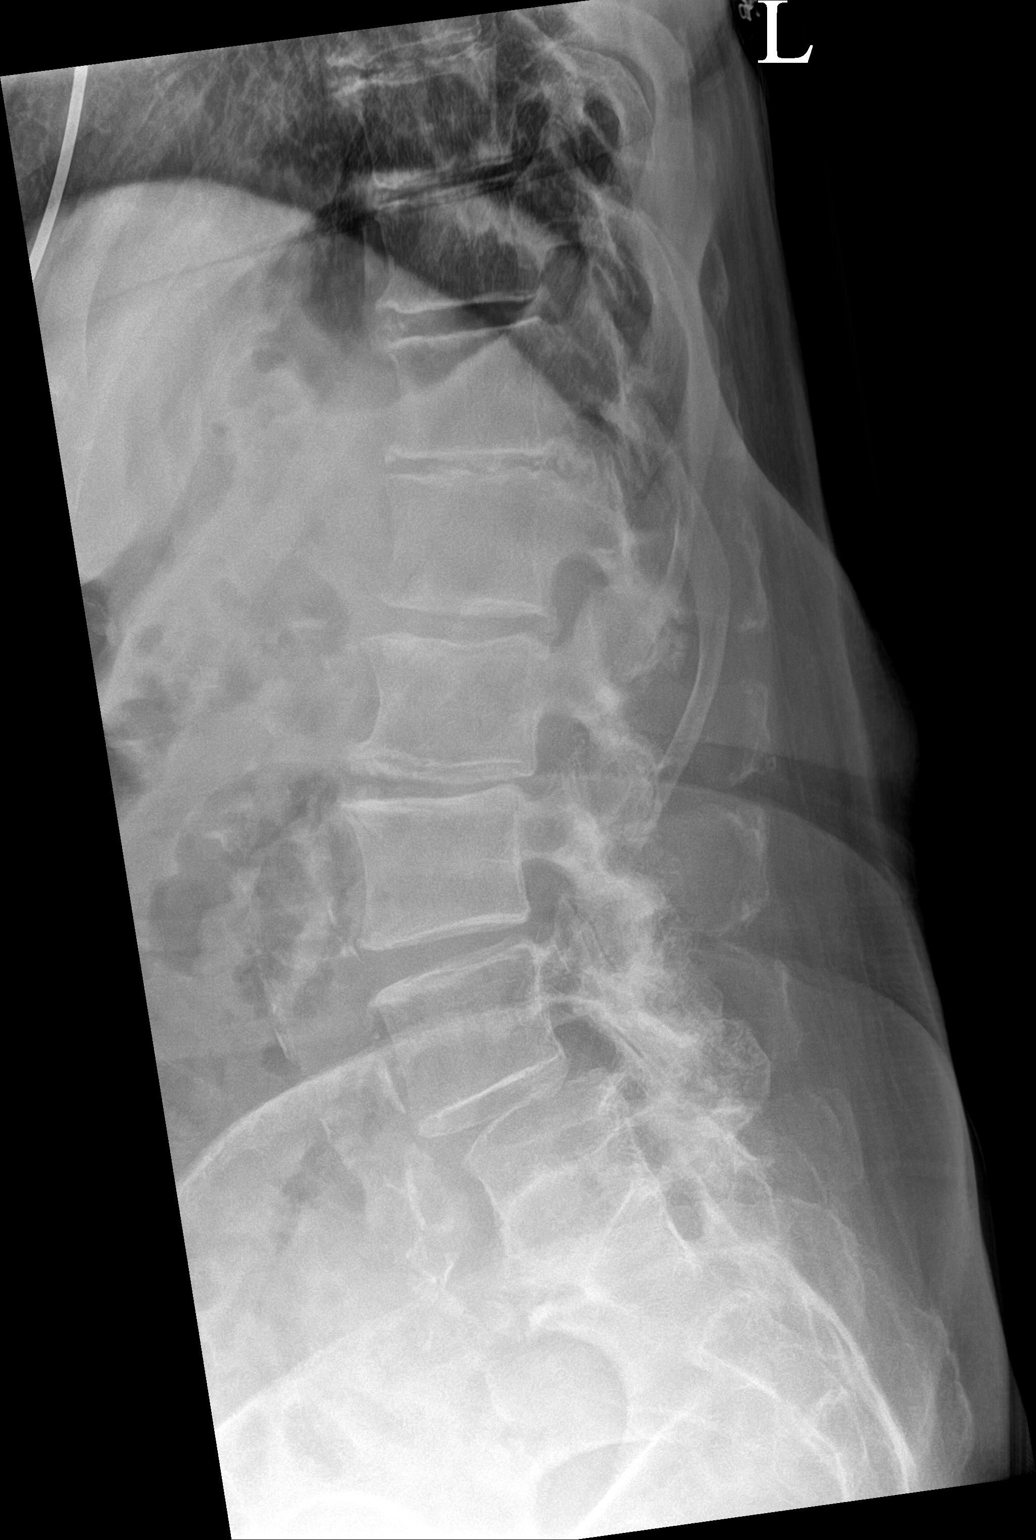

[l-spine spot]
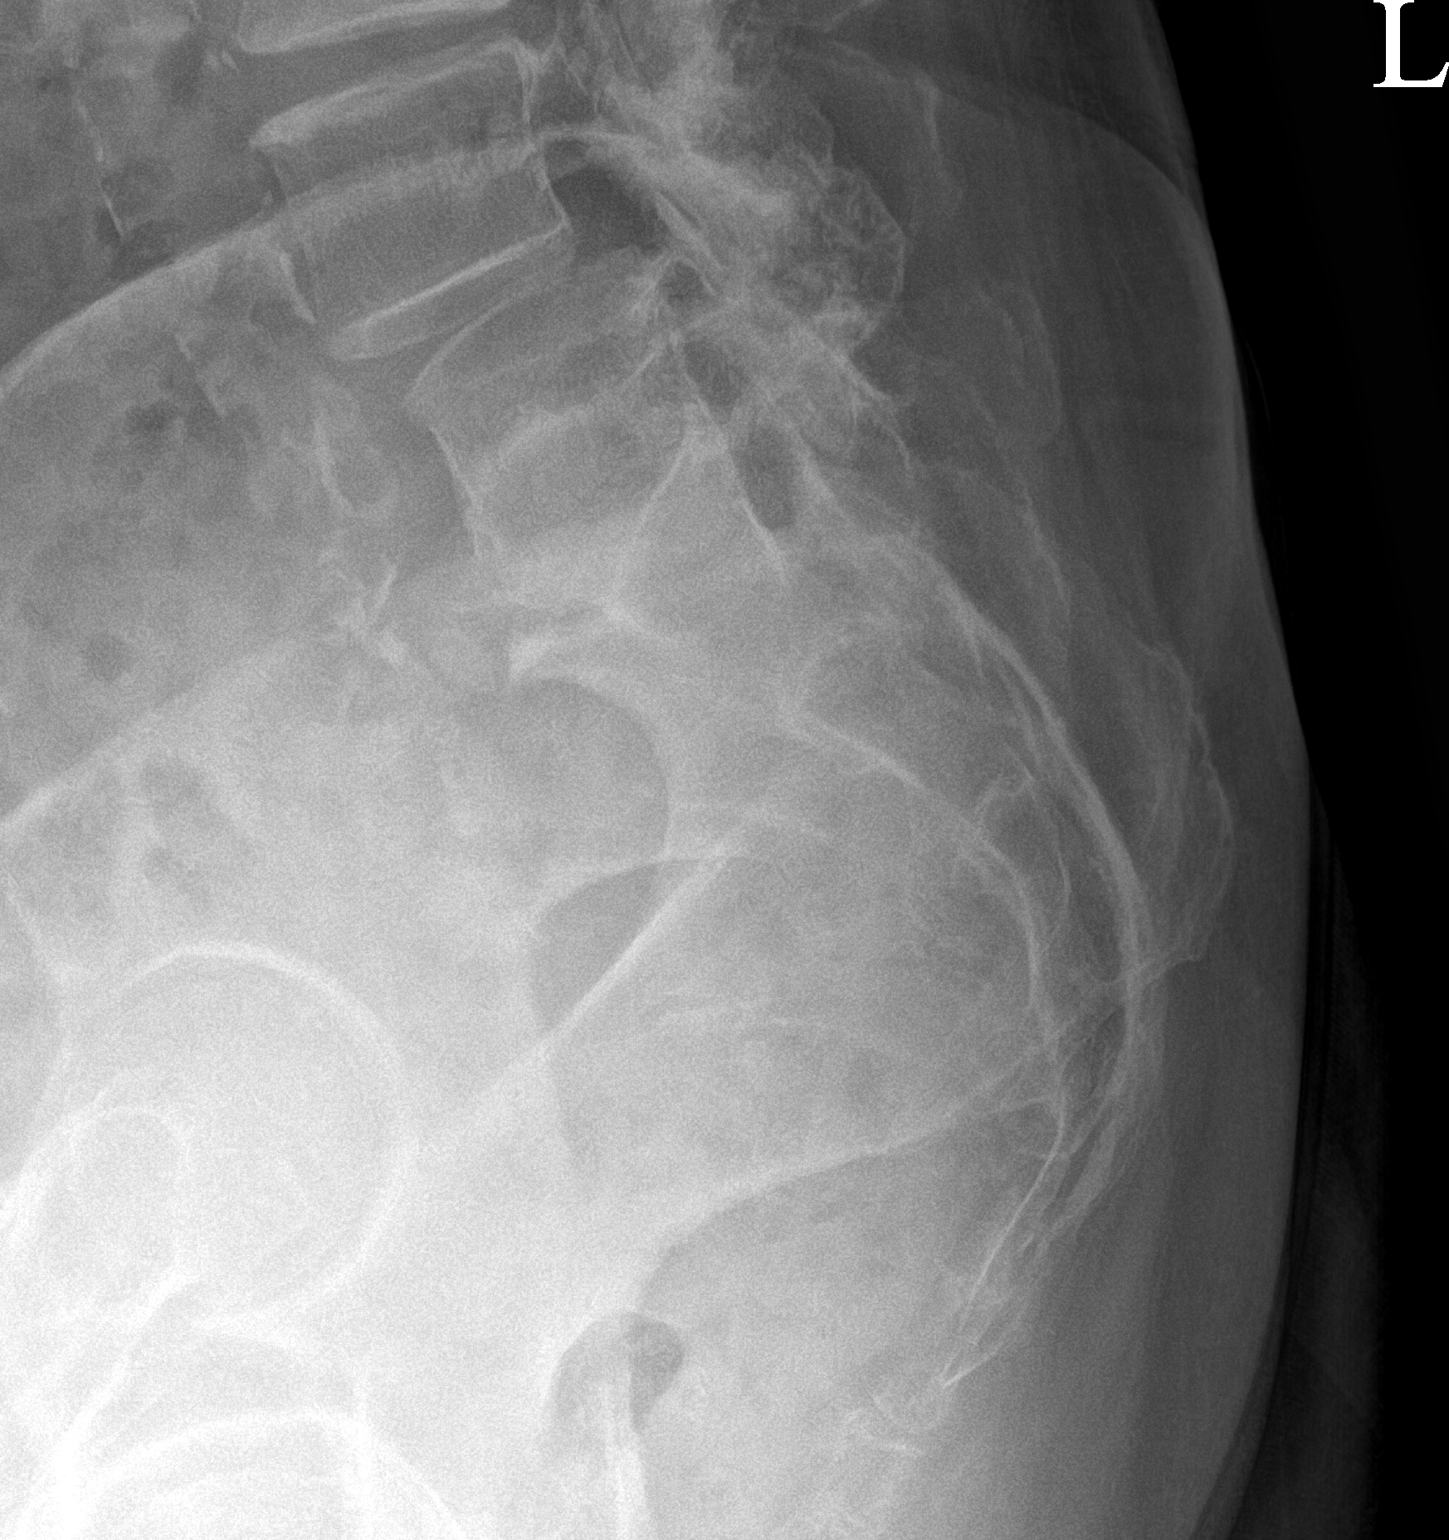

[3 of 3 positions shown; findings below may reference images not displayed]

FINDINGS: There is minimal levoscoliosis. No recent fracture is seen.
Degenerative changes are noted with disc space narrowing, bony spurs
and facet hypertrophy, more so at L2-L3 and L5-S1 levels. There is 9
mm anterolisthesis at L4-L5 level. Arterial calcifications are seen
in the aorta and its major branches.
IMPRESSION: No recent fracture is seen in the lumbar spine. Lumbar spondylosis,
particularly prominent at L2-L3 and L5-S1 levels. There is
first-degree anterolisthesis at L4-L5 level.

## 2023-09-19 IMAGING — DX DG CERVICAL SPINE 2 OR 3 VIEWS
3 series · 3 of 3 positions shown · non-contrast
Comparison: None.

CLINICAL DATA: Neck pain

EXAM:
CERVICAL SPINE - 2-3 VIEW

[c-spine lat]
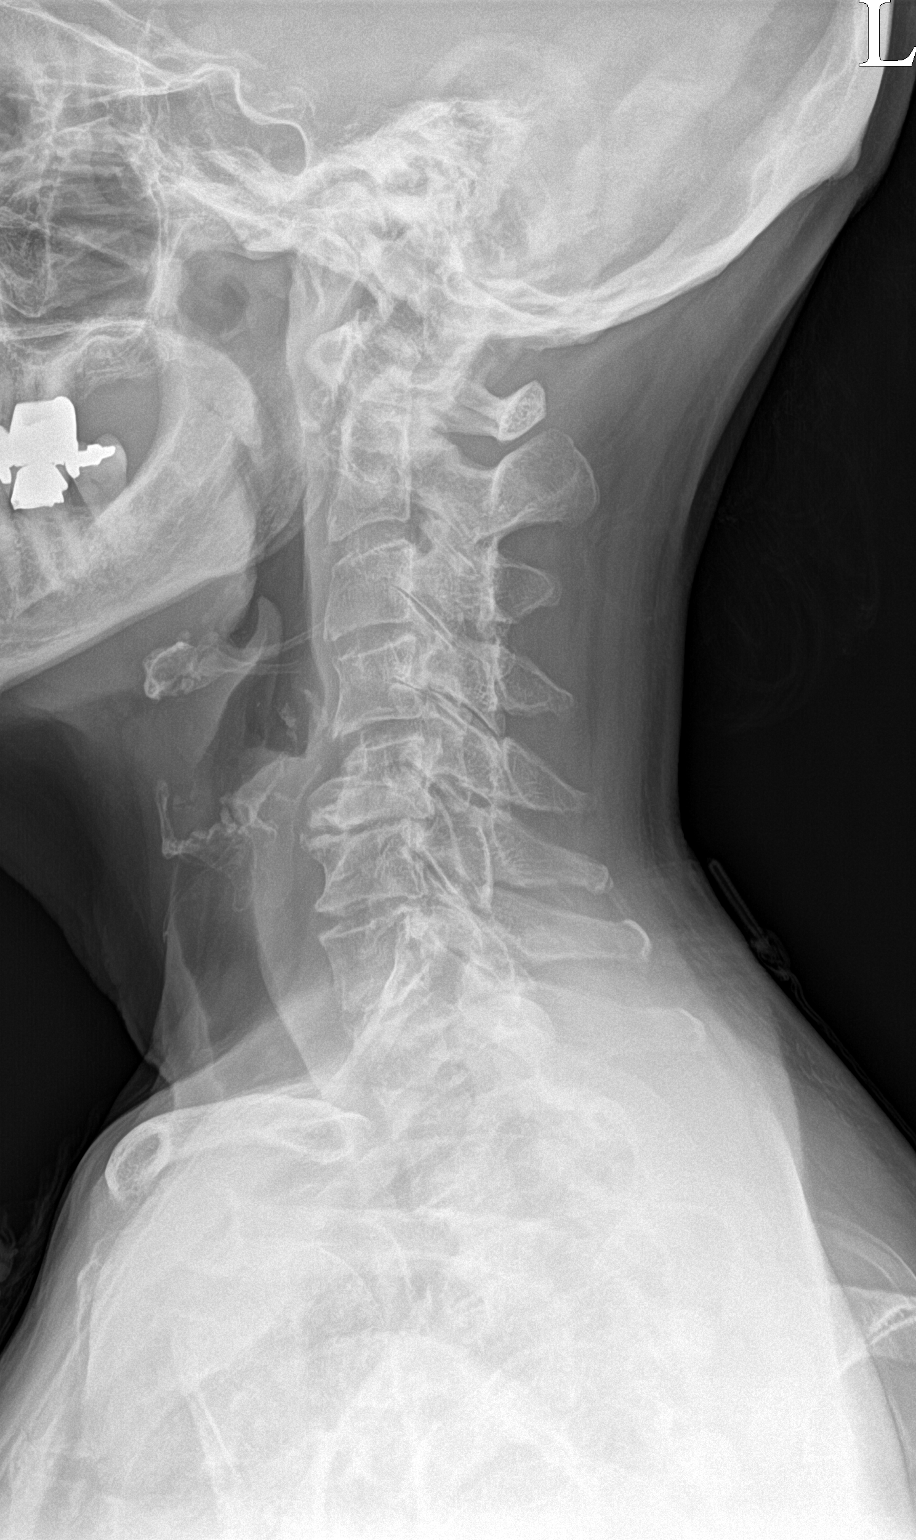

[c-spine ap]
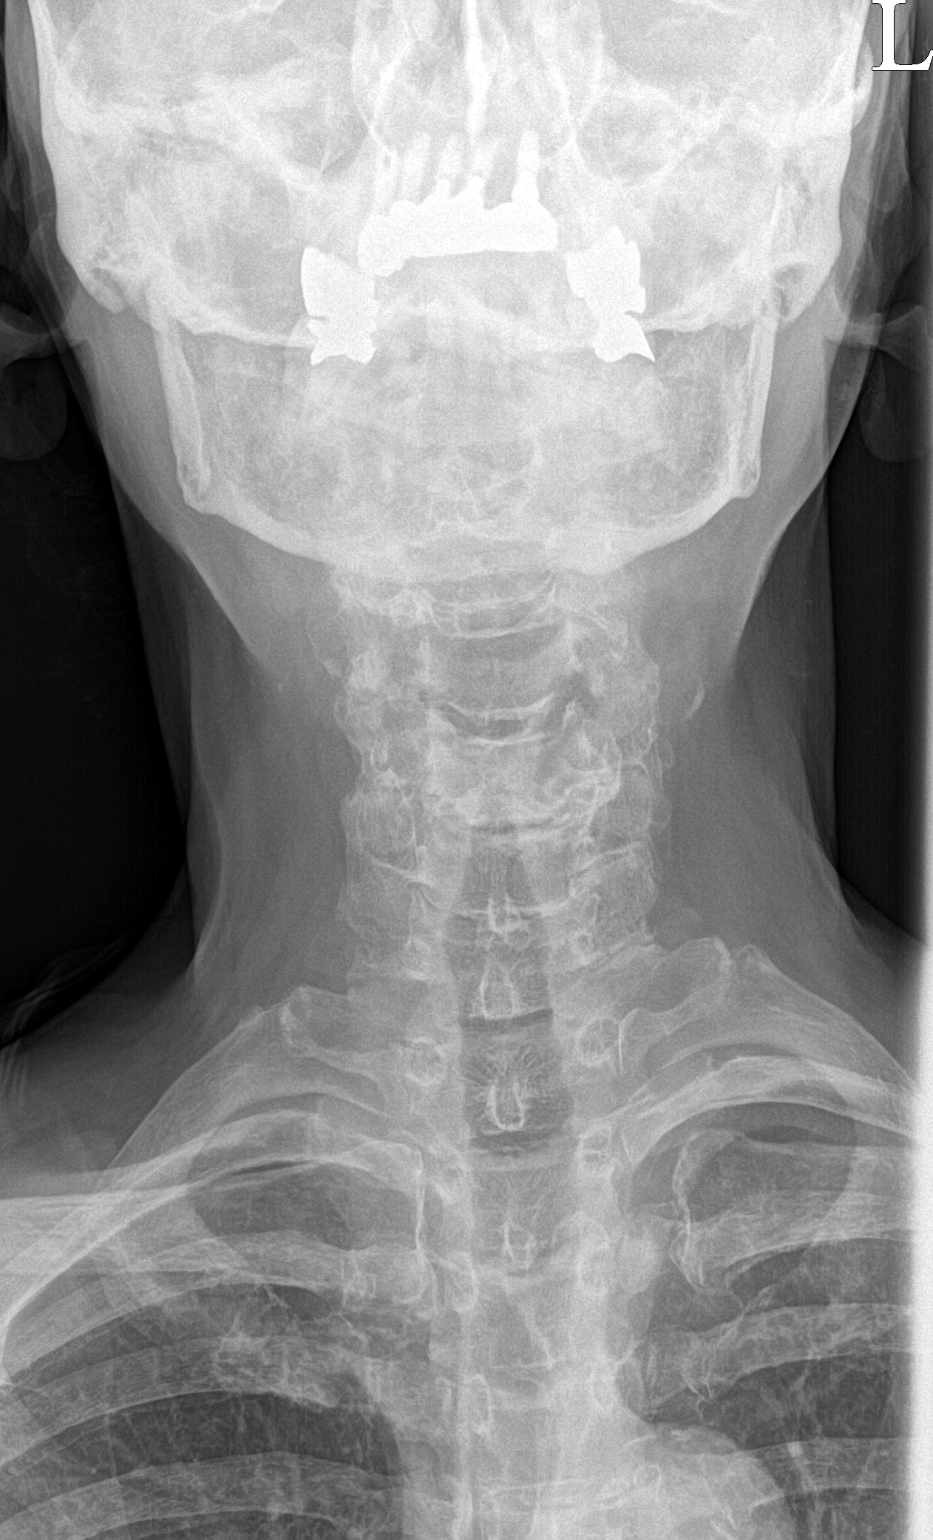

[c-spine open mouth]
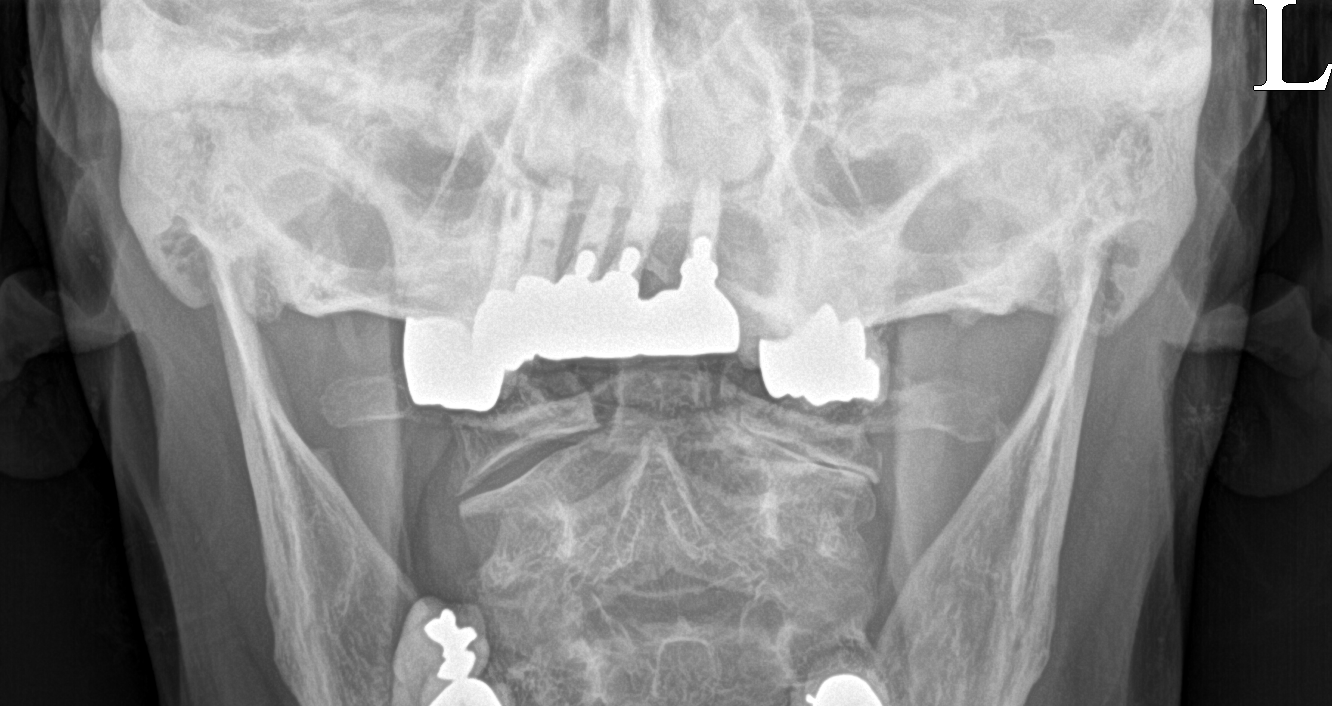

[3 of 3 positions shown; findings below may reference images not displayed]

FINDINGS: No recent fracture is seen. There is mild retrolisthesis at C5-C6
level. Degenerative changes are noted with bony spurs, disc space
narrowing and facet hypertrophy at multiple levels, more so at C5-C6
level. Evaluation of neural foramina is limited without oblique
views. As far as seen, there is encroachment of neural foramina from
C4-C7 levels.
IMPRESSION: No recent fracture is seen. There is minimal retrolisthesis at C5-C6
level, which may be due to previous ligament injury and facet
degeneration. There is encroachment of neural foramina from C4-C7
levels by bony spurs.

## 2023-10-23 ENCOUNTER — Ambulatory Visit: Payer: Medicare Other | Admitting: Family Medicine

## 2023-11-06 ENCOUNTER — Other Ambulatory Visit: Payer: Self-pay | Admitting: Gastroenterology

## 2023-11-06 DIAGNOSIS — K52831 Collagenous colitis: Secondary | ICD-10-CM

## 2023-11-18 ENCOUNTER — Ambulatory Visit: Payer: Medicare Other | Admitting: Family Medicine

## 2023-11-25 NOTE — Progress Notes (Deleted)
  [name] [address] [place] [address] Phone: [phone] Subjective:    I'm seeing this patient by the request  of:  Lauro Regulus, MD  CC:   MollyRivers  [name] is a 78 y.o. female coming in with complaint of back and neck pain. OMT on 08/21/2023. Patient states   Medications patient has been prescribed: Zanaflex  Taking:         Reviewed prior external information including notes and imaging from previsou exam, outside providers and external EMR if available.   As well as notes that were available from care everywhere and other healthcare systems.  Past medical history, social, surgical and family history all reviewed in electronic medical record.  No pertanent information unless stated regarding to the chief complaint.   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 POSITIVE muscle aches  Objective  There were no vitals taken for this visit.   General: No apparent distress alert and oriented x3 mood and affect normal, dressed appropriately.  HEENT: Pupils equal, extraocular movements intact  Respiratory: Patient's speak in full sentences and does not appear short of breath  Cardiovascular: No lower extremity edema, non tender, no erythema  Gait MSK:  Back   Osteopathic findings  C2 flexed rotated and side bent right C6 flexed rotated and side bent left T3 extended rotated and side  bent right inhaled rib T9 extended rotated and side bent left L2 flexed rotated and side bent right Sacrum right on right       Assessment and Plan:  No problem-specific Assessment & Plan notes found for this encounter.    Nonallopathic problems  Decision today to treat with OMT was based on Physical Exam  After verbal consent patient was treated with HVLA, ME, FPR techniques in cervical, rib, thoracic, lumbar, and sacral  areas  Patient tolerated the procedure well with improvement in symptoms  Patient given exercises, stretches and lifestyle modifications  See medications in patient instructions if given  Patient will follow up in 4-8 weeks             Note: This dictation was prepared with Dragon dictation along with smaller phrase technology. Any transcriptional errors that result from this process are unintentional.

## 2023-11-27 ENCOUNTER — Ambulatory Visit: Payer: Medicare Other | Admitting: Family Medicine

## 2024-03-26 NOTE — Progress Notes (Unsigned)
 [name] [address] [place] [address] Phone: [phone] Subjective:   Molly [name], am serving as a scribe for Dr. Arthea Claudene.  I'm seeing this patient by the request  of:  Molly [name] ORN, MD  CC: back and neck pain follow up   YEP:[name]  Molly [name] is a 78 y.o. female coming in with complaint of back and neck pain. OMT 08/21/2023. Patient states her neck and lumbar spine are painful after having to care for her ailing husband. Lost husband in May. Has been getting headaches.   Pain in L arm over medial epicondyle due to pushing in husband to table.   L knee pain over medial and posterior aspect due to squatting a lot to take care of husband.   Medications patient has been prescribed: Zanaflex   Taking:         Reviewed prior external information including notes and imaging from previsou exam, outside providers and external EMR if available.   As well as notes that were available from care everywhere and other healthcare systems.  Past medical history, social, surgical and family history all reviewed in electronic medical record.  No pertanent information unless stated regarding to the chief complaint.   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 POSITIVE muscle aches  Objective  Blood pressure 102/70, pulse 60, height 5' 7 (1.702 m), weight 174 lb (78.9 kg), SpO2 97%.   General: No  apparent distress alert and oriented x3 mood and affect normal, dressed appropriately.  HEENT: Pupils equal, extraocular movements intact  Respiratory: Patient's speak in full sentences and does not appear short of breath  Cardiovascular: No lower extremity edema, non tender, no erythema  Neck exam shows significant tenderness on the paraspinal musculature more actually on the right side than the left side.  Limited sidebending bilaterally.  Lacks last 10 degrees of extension of the neck.  No radicular symptoms at the moment.  Osteopathic findings  C2 flexed rotated and side bent right C6 flexed rotated and side bent left C7 flexed rotated and side bent left T4 extended rotated and side bent right inhaled rib T7 extended rotated and side bent left L2 flexed rotated and side bent right L3 flexed rotated and side bent left Sacrum right on right       Assessment and Plan:  DDD (degenerative disc disease), cervical Patient has had x-rays showing degenerative disc disease at multiple levels of the neck.  Discussed icing regimen and home exercises, discussed which activities to do and which ones to avoid.  Patient did have the passing of her longtime husband of 37 years has not been doing exercises as regularly.  Increase activity slowly.  Discussed icing regimen.  Follow-up again in 6 to 8 weeks.    Nonallopathic problems  Decision today to treat with OMT was based on Physical Exam  After verbal consent  patient was treated with HVLA, ME, FPR techniques in cervical, rib, thoracic, lumbar, and sacral  areas  Patient tolerated the procedure well with improvement in symptoms  Patient given exercises, stretches and lifestyle modifications  See medications in patient instructions if given  Patient will follow up in 4-8 weeks    The above documentation has been reviewed and is accurate and complete Arthea CHRISTELLA Sharps, DO          Note: This dictation was prepared with Dragon  dictation along with smaller phrase technology. Any transcriptional errors that result from this process are unintentional.

## 2024-04-01 ENCOUNTER — Ambulatory Visit: Admitting: Family Medicine

## 2024-04-01 ENCOUNTER — Encounter: Payer: Self-pay | Admitting: Family Medicine

## 2024-04-01 VITALS — BP 102/70 | HR 60 | Ht 67.0 in | Wt 174.0 lb

## 2024-04-01 DIAGNOSIS — M9908 Segmental and somatic dysfunction of rib cage: Secondary | ICD-10-CM | POA: Diagnosis not present

## 2024-04-01 DIAGNOSIS — M9901 Segmental and somatic dysfunction of cervical region: Secondary | ICD-10-CM | POA: Diagnosis not present

## 2024-04-01 DIAGNOSIS — M9902 Segmental and somatic dysfunction of thoracic region: Secondary | ICD-10-CM

## 2024-04-01 DIAGNOSIS — M9904 Segmental and somatic dysfunction of sacral region: Secondary | ICD-10-CM | POA: Diagnosis not present

## 2024-04-01 DIAGNOSIS — M5030 Other cervical disc degeneration, unspecified cervical region: Secondary | ICD-10-CM | POA: Diagnosis not present

## 2024-04-01 DIAGNOSIS — M9903 Segmental and somatic dysfunction of lumbar region: Secondary | ICD-10-CM | POA: Diagnosis not present

## 2024-04-01 NOTE — Assessment & Plan Note (Signed)
 Patient has had x-rays showing degenerative disc disease at multiple levels of the neck.  Discussed icing regimen and home exercises, discussed which activities to do and which ones to avoid.  Patient did have the passing of her longtime husband of 37 years has not been doing exercises as regularly.  Increase activity slowly.  Discussed icing regimen.  Follow-up again in 6 to 8 weeks.

## 2024-04-01 NOTE — Patient Instructions (Signed)
 Enjoy the family  Keep hands in peripheral vision See me in 4 weeks Ok to double book

## 2024-04-22 ENCOUNTER — Telehealth: Payer: Self-pay

## 2024-04-22 ENCOUNTER — Other Ambulatory Visit (HOSPITAL_COMMUNITY): Payer: Self-pay

## 2024-04-22 NOTE — Telephone Encounter (Signed)
 Pharmacy Patient Advocate Encounter   Received notification from CoverMyMeds that prior authorization for Budesonide  3MG  dr capsules is required/requested.   Insurance verification completed.   The patient is insured through [employer] .   Per test claim: xxx

## 2024-04-22 NOTE — Telephone Encounter (Signed)
 Spoke to patient to advise of insurance requirement for her to be seen in office before they will reauthorize budesonide . Patient has scheduled the next available appointment for 05/04/24 with Ellouise Console, PA-C.

## 2024-04-27 NOTE — Progress Notes (Unsigned)
 [name] [address] [place] [address] Phone: [phone] Subjective:   Molly [name], am serving as a scribe for Dr. Arthea Claudene.  I'm seeing this patient by the request  of:  Lenon Layman ORN, MD  CC: back and neck pain follow up   YEP:[name]  Molly [name] is a 78 y.o. female coming in with complaint of back and neck pain. OMT on 04/01/2024. Patient states that she has been doing a lot of yard work.   L knee is still sore.  Medications patient has been prescribed:   Taking:         Reviewed prior external information including notes and imaging from previsou exam, outside providers and external EMR if available.   As well as notes that were available from care everywhere and other healthcare systems.  Past medical history, social, surgical and family history all reviewed in electronic medical record.  No pertanent information unless stated regarding to the chief complaint.   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 POSITIVE muscle aches  Objective  Blood pressure 110/76, pulse 61, height 5' 7 (1.702 m), weight 134 lb (60.8 kg), SpO2 97%.   General: No apparent distress alert and oriented x3 mood and affect normal, dressed appropriately.  HEENT: Pupils equal, extraocular movements intact  Respiratory: Patient's speak in full sentences and does not appear short of breath  Cardiovascular: No  lower extremity edema, non tender, no erythema  Gait MSK:  Back does have some loss lordosis is noted.  Patient does have tightness noted.  Tightness Faber right greater than left.  Osteopathic findings  C2 flexed rotated and side bent right C6 flexed rotated and side bent left C7 flexed rotated and side bent right T3 extended rotated and side bent right inhaled rib T9 extended rotated and side bent left L2 flexed rotated and side bent right L3 flexed rotated and side bent left Sacrum right on right       Assessment and Plan:  DDD (degenerative disc disease), cervical Continues to have some chronic tightness noted in the neck.  Responds extremely well though to osteopathic manipulation at the moment.  Patient is chronic continue to try to stay active.  Has had some soreness recently.  Increase activity slowly otherwise.  Follow-up again in 6 to 8 weeks    Nonallopathic problems  Decision today to treat with OMT was based on Physical Exam  After verbal consent patient was treated with HVLA, ME, FPR techniques in cervical, rib, thoracic, lumbar, and sacral  areas  Patient tolerated the procedure well with improvement in symptoms  Patient given exercises, stretches and lifestyle modifications  See medications in patient instructions if given  Patient will follow up in 4-8 weeks     The above documentation has been reviewed and is accurate and complete Molly Meany M Freeland Pracht, DO  Note: This dictation was prepared with Dragon dictation along with smaller phrase technology. Any transcriptional errors that result from this process are unintentional.

## 2024-04-29 ENCOUNTER — Ambulatory Visit: Admitting: Family Medicine

## 2024-04-29 ENCOUNTER — Encounter: Payer: Self-pay | Admitting: Family Medicine

## 2024-04-29 VITALS — BP 110/76 | HR 61 | Ht 67.0 in | Wt 134.0 lb

## 2024-04-29 DIAGNOSIS — M9903 Segmental and somatic dysfunction of lumbar region: Secondary | ICD-10-CM | POA: Diagnosis not present

## 2024-04-29 DIAGNOSIS — M9904 Segmental and somatic dysfunction of sacral region: Secondary | ICD-10-CM | POA: Diagnosis not present

## 2024-04-29 DIAGNOSIS — M9902 Segmental and somatic dysfunction of thoracic region: Secondary | ICD-10-CM

## 2024-04-29 DIAGNOSIS — M9908 Segmental and somatic dysfunction of rib cage: Secondary | ICD-10-CM | POA: Diagnosis not present

## 2024-04-29 DIAGNOSIS — M9901 Segmental and somatic dysfunction of cervical region: Secondary | ICD-10-CM

## 2024-04-29 DIAGNOSIS — M5030 Other cervical disc degeneration, unspecified cervical region: Secondary | ICD-10-CM

## 2024-04-29 NOTE — Patient Instructions (Addendum)
 Good to see you I will get back to you about vacuum See me again in 2 months

## 2024-04-29 NOTE — Assessment & Plan Note (Signed)
 Continues to have some chronic tightness noted in the neck.  Responds extremely well though to osteopathic manipulation at the moment.  Patient is chronic continue to try to stay active.  Has had some soreness recently.  Increase activity slowly otherwise.  Follow-up again in 6 to 8 weeks

## 2024-05-02 NOTE — Progress Notes (Unsigned)
 Ellouise Console, PA-C [address] [place], [place]  [address] Phone: [phone]   Primary Care Physician: Lenon Layman ORN, MD  Primary Gastroenterologist:  Ellouise Console, PA-C / Elspeth Naval, MD   Chief Complaint: Follow-up collagenous colitis, refill budesonide .   HPI:   Molly [name] is a 78 y.o. female returns for annual follow-up of collagenous colitis.  She also has history of colon polyps.  Last seen here by Dr. Naval 03/2023.  11/2020 Last Colonoscopy by Dr. Eda (to evaluate chronic diarrhea): Biopsies positive for collagenous colitis.  Also had a small adenoma appendiceal orifice polyp which could not be removed endoscopically.  She underwent surgical resection by Dr. Teresa.  Surgical specimen was negative for any adenomatous tissue.  She is currently taking budesonide  3 mg 1 capsule 3 days per week (Monday, Wednesday, and Friday) which works well for collagenous colitis.  She tried stopping budesonide  and had recurrent diarrhea off medication.  Current dose is working well.  She denies adverse side effects to medication.  No bruising.  She also takes Imodium every other day which helps.  Denies NSAID use.  No GI concerns today.  03/2023: Last DEXA bone density: Normal T-score of the lumbar spine.  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  Repeat in 2 years.  Patient is reluctant to schedule any more repeat colonoscopies.  Endoscopic history: - Has had 3 colonoscopies. The first two with Dr. Kurt and small polyps were removed.  She does not believe these were precancerous polyps.  The procedures were performed without incident.   - Surveillance colonoscopy with Dr. Gaylyn was attempted 06/26/2015 but could not be performed due to poor prep.  Was successfully completed 06/27/2015 for personal history of colon polyps revealed 2 small rectosigmoid hyperplastic polyps, pancolonic diverticulosis, and a redundant sigmoid colon.  - Colonoscopy 11/29/20 revealed a  tubular adenoma at the AO and collagenous colitis.  A hyperplastic polyp was removed from the sigmoid colon.  Current Outpatient Medications  Medication Sig Dispense Refill   atorvastatin (LIPITOR) 20 MG tablet Take 20 mg by mouth daily.     Cholecalciferol 2000 UNITS CAPS Take 4,000 Units by mouth every morning.     citalopram (CELEXA) 10 MG tablet Take 10 mg by mouth daily.     Multiple Vitamins-Minerals (CENTRUM SILVER PO) Take 1 tablet by mouth daily.     Probiotic Product (PROBIOTIC PO) Take 1 tablet by mouth daily. Physician's Choice     tiZANidine  (ZANAFLEX ) 2 MG tablet Take 1 tablet (2 mg total) by mouth every 8 (eight) hours as needed for muscle spasms. 30 tablet 1   budesonide  (ENTOCORT EC ) 3 MG 24 hr capsule Take 1 capsule (3 mg total) by mouth daily. 30 capsule 3   No current facility-administered medications for this visit.    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    Upper respiratory infection    2023    Past Surgical History:  Procedure Laterality Date   CATARACT EXTRACTION Bilateral    COLONOSCOPY WITH PROPOFOL  N/A 06/26/2015   Procedure: COLONOSCOPY WITH PROPOFOL ;  Surgeon: Gladis RAYMOND Gaylyn, MD;  Location: [facility] ENDOSCOPY;  Service: Endoscopy;  Laterality: N/A;   COLONOSCOPY WITH PROPOFOL  N/A 06/27/2015   Procedure: COLONOSCOPY WITH PROPOFOL ;  Surgeon: Gladis RAYMOND Mariner, MD;  Location[facility] ENDOSCOPY;  Service: Endoscopy;  Laterality: N/A;   dental implants     LAPAROSCOPIC APPENDECTOMY N/A 02/07/2022   Procedure: APPENDECTOMY LAPAROSCOPIC;  Surgeon: Teresa Lonni HERO, MD;  Location: [facility] ORS;  Service: General;  Laterality: N/A;   REFRACTIVE SURGERY     TONSILLECTOMY      Review of Systems:    All systems reviewed  and negative except where noted in HPI.    Physical Exam:  BP 120/80   Pulse 60   Ht 5' 7 (1.702 m)   Wt 133 lb (60.3 kg)   BMI 20.83 kg/m  No LMP recorded. Patient is postmenopausal.  General: Well-nourished, well-developed in no acute distress.  Neuro: Alert and oriented x 3.  Grossly intact.  Psych: Alert and cooperative, normal mood and affect. Skin: No bruising.   Imaging Studies: No results found.  Labs: CBC    Component Value Date/Time   WBC 7.0 01/21/2022 1312   RBC 4.62 01/21/2022 1312   HGB 14.1 01/21/2022 1312   HCT 42.7 01/21/2022 1312   PLT 316 01/21/2022 1312   MCV 92.4 01/21/2022 1312   MCH 30.5 01/21/2022 1312   MCHC 33.0 01/21/2022 1312   RDW 13.5 01/21/2022 1312   LYMPHSABS 1.8 12/10/2021 1434   MONOABS 0.6 12/10/2021 1434   EOSABS 0.0 12/10/2021 1434   BASOSABS 0.1 12/10/2021 1434    CMP     Component Value Date/Time   NA 140 12/10/2021 1434   K 4.6 12/10/2021 1434   CL 105 12/10/2021 1434   CO2 25 12/10/2021 1434   GLUCOSE 80 12/10/2021 1434   BUN 17 12/10/2021 1434   CREATININE 0.72 12/10/2021 1434   CALCIUM 9.4 12/10/2021 1434   PROT 7.1 12/10/2021 1434   ALBUMIN 4.4 12/10/2021 1434   AST 25 12/10/2021 1434   ALT 21 12/10/2021 1434   ALKPHOS 59 12/10/2021 1434   BILITOT 0.5 12/10/2021 1434   GFRNONAA >60 12/10/2021 1434    Assessment and Plan:   Molly [name] is a 78 y.o. y/o female returns for annual follow-up of:  1.  Collagenous colitis -controlled on the low-dose budesonide  3 Mg 3 days per week. - Continue low-dose budesonide  3 Mg once daily on Modeay, Wednesday and Friday.   -  DEXA bone density scan is up-to-date (done 03/2023).  Repeat in 2 years. - Counseled on long term steroid use, use lowest dose of budesonide  needed to control colitis or use of an alternative. - Continue OTC Imodium as needed. - Try OTC FiberCon Tablets, Take 2 tablets once or twice daily with large glass of water.  2.  History of  adenomatous colon polyps - Patient declines any further repeat surveillance colonoscopies. - 1 small adenoma on her last exam 11/2020.    Ellouise Console, PA-C  Follow up 1 year.

## 2024-05-04 ENCOUNTER — Encounter: Payer: Self-pay | Admitting: Physician Assistant

## 2024-05-04 ENCOUNTER — Ambulatory Visit: Admitting: Physician Assistant

## 2024-05-04 VITALS — BP 120/80 | HR 60 | Ht 67.0 in | Wt 133.0 lb

## 2024-05-04 DIAGNOSIS — Z860100 Personal history of colon polyps, unspecified: Secondary | ICD-10-CM

## 2024-05-04 DIAGNOSIS — K52831 Collagenous colitis: Secondary | ICD-10-CM | POA: Diagnosis not present

## 2024-05-04 DIAGNOSIS — Z860101 Personal history of adenomatous and serrated colon polyps: Secondary | ICD-10-CM | POA: Diagnosis not present

## 2024-05-04 MED ORDER — BUDESONIDE 3 MG PO CPEP: 3.0000 mg | ORAL_CAPSULE | Freq: Every day | ORAL | 3 refills | Status: AC

## 2024-05-04 NOTE — Patient Instructions (Addendum)
   Take 2 Caplets with a full glass of water or other liquid (8 ounces/240 milliliters)  Once Daily.  Increase to 2 Caplets Twice daily if needed.   I sent prescription for Budesonide  3mg  once daily (Monday, Wednesday, Friday) to your pharmacy with refills.  Please follow up if you develop GI symptoms.  Due to recent changes in healthcare laws, you may see the results of your imaging and laboratory studies on MyChart before your provider has had a chance to review them.  We understand that in some cases there may be results that are confusing or concerning to you. Not all laboratory results come back in the same time frame and the provider may be waiting for multiple results in order to interpret others.  Please give us  48 hours in order for your provider to thoroughly review all the results before contacting the office for clarification of your results.   Thank you for trusting me with your gastrointestinal care!   Ellouise Console, PA-C _______________________________________________________  If your blood pressure at your visit was 140/90 or greater, please contact your primary care physician to follow up on this.  _______________________________________________________  If you are age 31 or older, your body mass index should be between 23-30. Your Body mass index is 20.83 kg/m. If this is out of the aforementioned range listed, please consider follow up with your Primary Care Provider.  If you are age 63 or younger, your body mass index should be between 19-25. Your Body mass index is 20.83 kg/m. If this is out of the aformentioned range listed, please consider follow up with your Primary Care Provider.   ________________________________________________________  The [facility] providers would like to encourage you to use MYCHART to communicate with providers for non-urgent requests or questions.  Due to long hold times on the telephone, sending your provider a message by [facility] may be a  faster and more efficient way to get a response.  Please allow 48 business hours for a response.  Please remember that this is for non-urgent requests.  _______________________________________________________

## 2024-05-05 ENCOUNTER — Telehealth: Payer: Self-pay

## 2024-05-05 NOTE — Progress Notes (Signed)
 Agree with assessment and plan as outlined. If she hasn't tried completely weaning off budesonide  yet, could use immodium / colestid / pepto, etc if she wanted to try and do that, up to her, vs continue low dose budesonide . I had an extensive discussion with her last year about long term use of budesonide .

## 2024-05-05 NOTE — Telephone Encounter (Signed)
 Pharmacy Patient Advocate Encounter  Received notification from [facility] [employer] that Prior Authorization for Budesonide  3MG  dr capsules has been APPROVED from 05-04-2024 to 05-05-2025   PA #/Case ID/Reference #: [id number]

## 2024-05-05 NOTE — Telephone Encounter (Signed)
 Pharmacy Patient Advocate Encounter   Received notification from CoverMyMeds that prior authorization for Budesonide  3MG  dr capsules is required/requested.   Insurance verification completed.   The patient is insured through [employer].   Per test claim: PA required; PA submitted to above mentioned insurance via CoverMyMeds Key/confirmation #/EOC [id number] Status is pending

## 2024-05-05 NOTE — Telephone Encounter (Signed)
 Received call from pts insurance company that Budesonide  has been approved. Rjdz#[id number]. Approval date good for 05/05/24-05/05/25.

## 2024-06-24 NOTE — Progress Notes (Deleted)
  [name] [address] [place] [address] Phone: [phone] Subjective:    I'm seeing this patient by the request  of:  Lenon Layman ORN, MD  CC:   YEP:[name]  Molly [name] is a 78 y.o. female coming in with complaint of back and neck pain. OMT on 04/29/2024. Patient states   Medications patient has been prescribed:   Taking:         Reviewed prior external information including notes and imaging from previsou exam, outside providers and external EMR if available.   As well as notes that were available from care everywhere and other healthcare systems.  Past medical history, social, surgical and family history all reviewed in electronic medical record.  No pertanent information unless stated regarding to the chief complaint.   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 POSITIVE muscle aches  Objective  There were no vitals taken for this visit.   General: No apparent distress alert and oriented x3 mood and affect normal, dressed appropriately.  HEENT: Pupils equal, extraocular movements intact  Respiratory: Patient's speak in full sentences and does not appear short of breath  Cardiovascular: No lower extremity edema, non tender, no erythema  Gait MSK:  Back   Osteopathic findings  C2 flexed rotated and side bent right C6 flexed rotated and side bent left T3 extended rotated and side bent right  inhaled rib T9 extended rotated and side bent left L2 flexed rotated and side bent right Sacrum right on right       Assessment and Plan:  No problem-specific Assessment & Plan notes found for this encounter.    Nonallopathic problems  Decision today to treat with OMT was based on Physical Exam  After verbal consent patient was treated with HVLA, ME, FPR techniques in cervical, rib, thoracic, lumbar, and sacral  areas  Patient tolerated the procedure well with improvement in symptoms  Patient given exercises, stretches and lifestyle modifications  See medications in patient instructions if given  Patient will follow up in 4-8 weeks             Note: This dictation was prepared with Dragon dictation along with smaller phrase technology. Any transcriptional errors that result from this process are unintentional.

## 2024-06-29 ENCOUNTER — Ambulatory Visit: Admitting: Family Medicine

## 2024-07-01 NOTE — Progress Notes (Unsigned)
 [name] [address] [place] [address] Phone: [phone] Subjective:   ISusannah [name], am serving as a scribe for Dr. Arthea Claudene.  I'm seeing this patient by the request  of:  Lenon Layman ORN, MD  CC: Back and neck pain follow-up  YEP:[name]  Molly [name] is a 78 y.o. female coming in with complaint of back and neck pain. OMT on 04/29/2024. Patient states continues to have some discomfort.  Trying to be active.  Recently though did take out the close from her recently deceased husband's closet.  That caused some setback.          Reviewed prior external information including notes and imaging from previsou exam, outside providers and external EMR if available.   As well as notes that were available from care everywhere and other healthcare systems.  Past medical history, social, surgical and family history all reviewed in electronic medical record.  No pertanent information unless stated regarding to the chief complaint.   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 POSITIVE muscle aches  Objective  Blood pressure 118/74, pulse 89, height 5' 7 (1.702 m), weight 133 lb (60.3 kg), SpO2 98%.   General: No apparent distress alert and oriented x3 mood and affect normal, dressed appropriately.  HEENT: Pupils equal, extraocular movements intact  Respiratory: Patient's speak in full sentences and does not  appear short of breath  Cardiovascular: No lower extremity edema, non tender, no erythema  Neck exam does have some loss of lordosis.  Some tenderness to palpation in the paraspinal musculature.  Osteopathic findings  C2 flexed rotated and side bent right C6 flexed rotated and side bent left T3 extended rotated and side bent right inhaled rib T9 extended rotated and side bent left L2 flexed rotated and side bent right L5 flexed rotated and side bent left Sacrum right on right       Assessment and Plan:  DDD (degenerative disc disease), cervical Known degenerative disc disease.  Trying to stay active.  Discussed posture and ergonomics, discussed home exercises, increase activity slowly.  Follow-up with me again in 6 to 8 weeks.    Nonallopathic problems  Decision today to treat with OMT was based on Physical Exam  After verbal consent patient was treated with HVLA, ME, FPR techniques in cervical, rib, thoracic, lumbar, and sacral  areas avoided HVLA on the cervical spine  Patient tolerated the procedure well with improvement in symptoms  Patient given exercises, stretches and lifestyle modifications  See medications in patient instructions if given  Patient will follow up in 4-8 weeks     The above documentation has been reviewed and is accurate and complete Conor Lata M Kerina Simoneau, DO         Note: This dictation was prepared with Dragon dictation along with smaller phrase technology. Any transcriptional errors that result from this process are unintentional.

## 2024-07-05 ENCOUNTER — Ambulatory Visit (INDEPENDENT_AMBULATORY_CARE_PROVIDER_SITE_OTHER): Admitting: Family Medicine

## 2024-07-05 VITALS — BP 118/74 | HR 89 | Ht 67.0 in | Wt 133.0 lb

## 2024-07-05 DIAGNOSIS — M9902 Segmental and somatic dysfunction of thoracic region: Secondary | ICD-10-CM

## 2024-07-05 DIAGNOSIS — M9904 Segmental and somatic dysfunction of sacral region: Secondary | ICD-10-CM

## 2024-07-05 DIAGNOSIS — M545 Low back pain, unspecified: Secondary | ICD-10-CM | POA: Diagnosis not present

## 2024-07-05 DIAGNOSIS — M5030 Other cervical disc degeneration, unspecified cervical region: Secondary | ICD-10-CM | POA: Diagnosis not present

## 2024-07-05 DIAGNOSIS — M9903 Segmental and somatic dysfunction of lumbar region: Secondary | ICD-10-CM

## 2024-07-05 DIAGNOSIS — M9901 Segmental and somatic dysfunction of cervical region: Secondary | ICD-10-CM | POA: Diagnosis not present

## 2024-07-05 DIAGNOSIS — M9908 Segmental and somatic dysfunction of rib cage: Secondary | ICD-10-CM | POA: Diagnosis not present

## 2024-07-06 ENCOUNTER — Encounter: Payer: Self-pay | Admitting: Family Medicine

## 2024-07-06 NOTE — Assessment & Plan Note (Signed)
 Known degenerative disc disease.  Trying to stay active.  Discussed posture and ergonomics, discussed home exercises, increase activity slowly.  Follow-up with me again in 6 to 8 weeks.

## 2024-07-06 NOTE — Assessment & Plan Note (Signed)
 Chronic problems well, trying to stay active.  Mild degenerative scoliosis noted.  Continue to work on core posture.  Sees manual masseuse that I think is helpful.  Follow-up again in 6 to 8 weeks  I personally spent a total of 33 minutes in the care of the patient today including preparing to see the patient, getting/reviewing separately obtained history, performing a medically appropriate exam/evaluation, counseling and educating, and documenting clinical information in the EHR.

## 2024-09-03 NOTE — Progress Notes (Signed)
 [name] [address] [place] [address] Phone: [phone] Subjective:   ISusannah [name], am serving as a scribe for Dr. Arthea Claudene.  I'm seeing this patient by the request  of:  Molly [name] ORN, MD  CC: Back and neck pain follow-up  YEP:[name]  Molly [name] is a 78 y.o. female coming in with complaint of back and neck pain. OMT on 07/05/2024. Patient states doing well. Same per usual. No new symptoms.  Medications patient has been prescribed:   Taking:         Reviewed prior external information including notes and imaging from previsou exam, outside providers and external EMR if available.   As well as notes that were available from care everywhere and other healthcare systems.  Past medical history, social, surgical and family history all reviewed in electronic medical record.  No pertanent information unless stated regarding to the chief complaint.   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 POSITIVE muscle aches  Objective  Blood pressure 118/82, pulse 67, height 5' 7 (1.702 m), weight 134 lb (60.8 kg), SpO2 96%.   General: No apparent distress alert and oriented x3 mood and affect normal, dressed appropriately.  HEENT: Pupils equal, extraocular movements intact  Respiratory: Patient's speak in full sentences and does not appear short of breath  Cardiovascular: No lower extremity edema, non  tender, no erythema  Gait normal  MSK:  Neck exam does have some loss of lordosis noted.  Some tenderness to palpation in the paraspinal musculature.  Tightness noted in the parascapular area.  Osteopathic findings  C2 flexed rotated and side bent right C6 flexed rotated and side bent left T2 extended rotated and side bent left inhaled rib T5 extended rotated and side bent left inhaled rib L2 flexed rotated and side bent right Sacrum right on right       Assessment and Plan:  DDD (degenerative disc disease), cervical Chronic problem and stable.  Discussed icing regimen and home exercises, discussed which activities to do and which ones to avoid.  Increasing activity slowly.  Patient is doing very well with some tough times patient has had emotionally.  Patient does have a good support system.  Follow-up with me again in 6 to 12 weeks    Nonallopathic problems  Decision today to treat with OMT was based on Physical Exam  After verbal consent patient was treated with HVLA, ME, FPR techniques in cervical, rib, thoracic, lumbar, and sacral  areas  Patient tolerated the procedure well with improvement in symptoms  Patient given exercises, stretches and lifestyle modifications  See medications in patient instructions if given  Patient will follow up in 4-8 weeks    The above documentation has been reviewed and is accurate and complete Honor Fairbank M Delayne Sanzo, DO          Note: This dictation was prepared with [place]  dictation along with smaller phrase technology. Any transcriptional errors that result from this process are unintentional.

## 2024-09-07 ENCOUNTER — Encounter: Payer: Self-pay | Admitting: Family Medicine

## 2024-09-07 ENCOUNTER — Ambulatory Visit: Admitting: Family Medicine

## 2024-09-07 VITALS — BP 118/82 | HR 67 | Ht 67.0 in | Wt 134.0 lb

## 2024-09-07 DIAGNOSIS — M9901 Segmental and somatic dysfunction of cervical region: Secondary | ICD-10-CM

## 2024-09-07 DIAGNOSIS — M9903 Segmental and somatic dysfunction of lumbar region: Secondary | ICD-10-CM

## 2024-09-07 DIAGNOSIS — M5030 Other cervical disc degeneration, unspecified cervical region: Secondary | ICD-10-CM

## 2024-09-07 DIAGNOSIS — M9908 Segmental and somatic dysfunction of rib cage: Secondary | ICD-10-CM

## 2024-09-07 DIAGNOSIS — M9904 Segmental and somatic dysfunction of sacral region: Secondary | ICD-10-CM

## 2024-09-07 DIAGNOSIS — M9902 Segmental and somatic dysfunction of thoracic region: Secondary | ICD-10-CM

## 2024-09-07 NOTE — Assessment & Plan Note (Signed)
 Chronic problem and stable.  Discussed icing regimen and home exercises, discussed which activities to do and which ones to avoid.  Increasing activity slowly.  Patient is doing very well with some tough times patient has had emotionally.  Patient does have a good support system.  Follow-up with me again in 6 to 12 weeks

## 2024-09-07 NOTE — Patient Instructions (Signed)
See you again in 6-8 weeks 

## 2024-09-13 ENCOUNTER — Other Ambulatory Visit: Payer: Self-pay

## 2024-09-13 ENCOUNTER — Other Ambulatory Visit: Payer: Self-pay | Admitting: Family Medicine

## 2024-09-13 MED ORDER — TIZANIDINE HCL 2 MG PO TABS: 2.0000 mg | ORAL_TABLET | Freq: Every day | ORAL | 0 refills | Status: AC

## 2024-10-25 ENCOUNTER — Ambulatory Visit: Admitting: Family Medicine

## 2024-11-18 ENCOUNTER — Ambulatory Visit: Admitting: Family Medicine

## 2024-12-01 ENCOUNTER — Ambulatory Visit: Admitting: Family Medicine
